# Patient Record
Sex: Female | Born: 1965 | Race: White | Hispanic: No | Marital: Married | Smoking: Never smoker
Health system: Southern US, Community
[De-identification: ages and names within clinical notes are randomized; demographics above are authoritative.]

## PROBLEM LIST (undated history)

## (undated) DIAGNOSIS — E079 Disorder of thyroid, unspecified: Secondary | ICD-10-CM

## (undated) DIAGNOSIS — E039 Hypothyroidism, unspecified: Secondary | ICD-10-CM

## (undated) HISTORY — PX: COLONOSCOPY: SHX174

---

## 2004-10-06 ENCOUNTER — Other Ambulatory Visit: Admission: RE | Admit: 2004-10-06 | Discharge: 2004-10-06 | Payer: Self-pay | Admitting: Obstetrics and Gynecology

## 2004-10-20 ENCOUNTER — Ambulatory Visit (HOSPITAL_COMMUNITY): Admission: RE | Admit: 2004-10-20 | Discharge: 2004-10-20 | Payer: Self-pay | Admitting: Obstetrics and Gynecology

## 2006-08-28 ENCOUNTER — Other Ambulatory Visit: Admission: RE | Admit: 2006-08-28 | Discharge: 2006-08-28 | Payer: Self-pay | Admitting: Obstetrics and Gynecology

## 2006-11-20 ENCOUNTER — Encounter: Admission: RE | Admit: 2006-11-20 | Discharge: 2006-11-20 | Payer: Self-pay | Admitting: Obstetrics and Gynecology

## 2007-11-05 ENCOUNTER — Other Ambulatory Visit: Admission: RE | Admit: 2007-11-05 | Discharge: 2007-11-05 | Payer: Self-pay | Admitting: Obstetrics and Gynecology

## 2007-12-10 ENCOUNTER — Encounter: Admission: RE | Admit: 2007-12-10 | Discharge: 2007-12-10 | Payer: Self-pay | Admitting: Obstetrics and Gynecology

## 2008-12-29 ENCOUNTER — Other Ambulatory Visit: Admission: RE | Admit: 2008-12-29 | Discharge: 2008-12-29 | Payer: Self-pay | Admitting: Obstetrics and Gynecology

## 2009-01-07 ENCOUNTER — Encounter: Admission: RE | Admit: 2009-01-07 | Discharge: 2009-01-07 | Payer: Self-pay | Admitting: Obstetrics and Gynecology

## 2010-03-04 ENCOUNTER — Other Ambulatory Visit: Admission: RE | Admit: 2010-03-04 | Discharge: 2010-03-04 | Payer: Self-pay | Admitting: Obstetrics and Gynecology

## 2010-03-10 ENCOUNTER — Encounter: Admission: RE | Admit: 2010-03-10 | Discharge: 2010-03-10 | Payer: Self-pay | Admitting: Obstetrics and Gynecology

## 2011-03-18 ENCOUNTER — Other Ambulatory Visit: Payer: Self-pay | Admitting: Obstetrics and Gynecology

## 2011-03-18 ENCOUNTER — Other Ambulatory Visit (HOSPITAL_COMMUNITY)
Admission: RE | Admit: 2011-03-18 | Discharge: 2011-03-18 | Disposition: A | Discharge status: Home / Self Care | Payer: BC Managed Care – PPO | Source: Ambulatory Visit | Attending: Obstetrics and Gynecology | Admitting: Obstetrics and Gynecology

## 2011-03-18 DIAGNOSIS — Z1231 Encounter for screening mammogram for malignant neoplasm of breast: Secondary | ICD-10-CM

## 2011-03-18 DIAGNOSIS — Z01419 Encounter for gynecological examination (general) (routine) without abnormal findings: Secondary | ICD-10-CM | POA: Insufficient documentation

## 2011-03-31 ENCOUNTER — Ambulatory Visit
Admission: RE | Admit: 2011-03-31 | Discharge: 2011-03-31 | Disposition: A | Discharge status: Home / Self Care | Payer: BC Managed Care – PPO | Source: Ambulatory Visit | Attending: Obstetrics and Gynecology | Admitting: Obstetrics and Gynecology

## 2011-03-31 DIAGNOSIS — Z1231 Encounter for screening mammogram for malignant neoplasm of breast: Secondary | ICD-10-CM

## 2012-05-09 ENCOUNTER — Other Ambulatory Visit: Payer: Self-pay | Admitting: Obstetrics and Gynecology

## 2012-05-09 ENCOUNTER — Other Ambulatory Visit (HOSPITAL_COMMUNITY)
Admission: RE | Admit: 2012-05-09 | Discharge: 2012-05-09 | Disposition: A | Discharge status: Home / Self Care | Payer: BC Managed Care – PPO | Source: Ambulatory Visit | Attending: Obstetrics and Gynecology | Admitting: Obstetrics and Gynecology

## 2012-05-09 DIAGNOSIS — Z01419 Encounter for gynecological examination (general) (routine) without abnormal findings: Secondary | ICD-10-CM | POA: Insufficient documentation

## 2012-05-14 ENCOUNTER — Other Ambulatory Visit: Payer: Self-pay | Admitting: Obstetrics and Gynecology

## 2012-05-14 DIAGNOSIS — Z1231 Encounter for screening mammogram for malignant neoplasm of breast: Secondary | ICD-10-CM

## 2012-05-16 ENCOUNTER — Ambulatory Visit
Admission: RE | Admit: 2012-05-16 | Discharge: 2012-05-16 | Disposition: A | Discharge status: Home / Self Care | Payer: BC Managed Care – PPO | Source: Ambulatory Visit | Attending: Obstetrics and Gynecology | Admitting: Obstetrics and Gynecology

## 2012-05-16 DIAGNOSIS — Z1231 Encounter for screening mammogram for malignant neoplasm of breast: Secondary | ICD-10-CM

## 2013-05-09 ENCOUNTER — Other Ambulatory Visit: Payer: Self-pay | Admitting: Obstetrics and Gynecology

## 2013-05-09 ENCOUNTER — Other Ambulatory Visit (HOSPITAL_COMMUNITY)
Admission: RE | Admit: 2013-05-09 | Discharge: 2013-05-09 | Disposition: A | Discharge status: Home / Self Care | Payer: BC Managed Care – PPO | Source: Ambulatory Visit | Attending: Obstetrics and Gynecology | Admitting: Obstetrics and Gynecology

## 2013-05-09 DIAGNOSIS — Z1151 Encounter for screening for human papillomavirus (HPV): Secondary | ICD-10-CM | POA: Insufficient documentation

## 2013-05-09 DIAGNOSIS — Z01419 Encounter for gynecological examination (general) (routine) without abnormal findings: Secondary | ICD-10-CM | POA: Insufficient documentation

## 2013-05-21 ENCOUNTER — Other Ambulatory Visit: Payer: Self-pay

## 2013-05-21 DIAGNOSIS — Z1231 Encounter for screening mammogram for malignant neoplasm of breast: Secondary | ICD-10-CM

## 2013-06-02 ENCOUNTER — Observation Stay (HOSPITAL_COMMUNITY)
Admission: EM | Admit: 2013-06-02 | Discharge: 2013-06-03 | Disposition: A | Discharge status: Home / Self Care | Payer: BC Managed Care – PPO | Attending: Internal Medicine | Admitting: Internal Medicine

## 2013-06-02 ENCOUNTER — Encounter (HOSPITAL_COMMUNITY): Payer: Self-pay | Admitting: Emergency Medicine

## 2013-06-02 DIAGNOSIS — M549 Dorsalgia, unspecified: Secondary | ICD-10-CM

## 2013-06-02 DIAGNOSIS — M79609 Pain in unspecified limb: Secondary | ICD-10-CM | POA: Insufficient documentation

## 2013-06-02 DIAGNOSIS — M79601 Pain in right arm: Secondary | ICD-10-CM

## 2013-06-02 DIAGNOSIS — R6884 Jaw pain: Principal | ICD-10-CM

## 2013-06-02 DIAGNOSIS — E039 Hypothyroidism, unspecified: Secondary | ICD-10-CM | POA: Insufficient documentation

## 2013-06-02 HISTORY — DX: Disorder of thyroid, unspecified: E07.9

## 2013-06-02 LAB — CBC WITH DIFFERENTIAL/PLATELET
Basophils Absolute: 0.1 10*3/uL (ref 0.0–0.1)
HCT: 36.5 % (ref 36.0–46.0)
Lymphocytes Relative: 21 % (ref 12–46)
Monocytes Absolute: 0.4 10*3/uL (ref 0.1–1.0)
Neutro Abs: 5.4 10*3/uL (ref 1.7–7.7)
Neutrophils Relative %: 70 % (ref 43–77)
RDW: 12.3 % (ref 11.5–15.5)
WBC: 7.7 10*3/uL (ref 4.0–10.5)

## 2013-06-02 LAB — TROPONIN I
Troponin I: <0.3 ng/mL (ref <0.30)
Troponin I: <0.3 ng/mL (ref <0.30)

## 2013-06-02 LAB — CBC
Platelets: 375 10*3/uL (ref 150–400)
RDW: 12.4 % (ref 11.5–15.5)
WBC: 6.7 10*3/uL (ref 4.0–10.5)

## 2013-06-02 LAB — URINALYSIS, ROUTINE W REFLEX MICROSCOPIC
Hgb urine dipstick: NEGATIVE
Nitrite: NEGATIVE
Specific Gravity, Urine: 1.007 (ref 1.005–1.030)
Urobilinogen, UA: 0.2 mg/dL (ref 0.0–1.0)
pH: 6.5 (ref 5.0–8.0)

## 2013-06-02 LAB — POCT I-STAT TROPONIN I: Troponin i, poc: 0.01 ng/mL (ref 0.00–0.08)

## 2013-06-02 LAB — GLUCOSE, CAPILLARY: Glucose-Capillary: 100 mg/dL — ABNORMAL HIGH (ref 70–99)

## 2013-06-02 LAB — BASIC METABOLIC PANEL
CO2: 26 mEq/L (ref 19–32)
Chloride: 104 mEq/L (ref 96–112)
Creatinine, Ser: 0.86 mg/dL (ref 0.50–1.10)
GFR calc Af Amer: >90 mL/min (ref >90)
Potassium: 4 mEq/L (ref 3.5–5.1)
Sodium: 139 mEq/L (ref 135–145)

## 2013-06-02 LAB — CREATININE, SERUM
GFR calc Af Amer: >90 mL/min (ref >90)
GFR calc non Af Amer: >90 mL/min (ref >90)

## 2013-06-02 MED ORDER — MORPHINE SULFATE 2 MG/ML IJ SOLN: 2.0000 mg | INTRAMUSCULAR | Status: DC | PRN

## 2013-06-02 MED ORDER — SODIUM CHLORIDE 0.9 % IJ SOLN
3.0000 mL | Freq: Two times a day (BID) | INTRAMUSCULAR | Status: DC
  Administered 2013-06-02 – 2013-06-03 (×3): 3 mL via INTRAVENOUS

## 2013-06-02 MED ORDER — ACETAMINOPHEN 325 MG PO TABS
650.0000 mg | ORAL_TABLET | Freq: Four times a day (QID) | ORAL | Status: DC | PRN
  Administered 2013-06-02 (×2): 650 mg via ORAL
  Filled 2013-06-02 (×2): qty 2

## 2013-06-02 MED ORDER — SODIUM CHLORIDE 0.9 % IV SOLN
INTRAVENOUS | Status: DC
  Administered 2013-06-02: 10:00:00 via INTRAVENOUS

## 2013-06-02 MED ORDER — NITROGLYCERIN 0.4 MG SL SUBL
0.4000 mg | SUBLINGUAL_TABLET | SUBLINGUAL | Status: AC | PRN
  Administered 2013-06-02 (×3): 0.4 mg via SUBLINGUAL

## 2013-06-02 MED ORDER — ASPIRIN 81 MG PO CHEW
81.0000 mg | CHEWABLE_TABLET | Freq: Every day | ORAL | Status: DC
  Administered 2013-06-02 – 2013-06-03 (×2): 81 mg via ORAL
  Filled 2013-06-02 (×2): qty 1

## 2013-06-02 MED ORDER — BISACODYL 5 MG PO TBEC
5.0000 mg | DELAYED_RELEASE_TABLET | Freq: Every day | ORAL | Status: DC | PRN
  Filled 2013-06-02: qty 1

## 2013-06-02 MED ORDER — LEVOTHYROXINE SODIUM 125 MCG PO TABS
125.0000 ug | ORAL_TABLET | Freq: Every day | ORAL | Status: DC
  Administered 2013-06-02 – 2013-06-03 (×2): 125 ug via ORAL
  Filled 2013-06-02 (×4): qty 1

## 2013-06-02 MED ORDER — NITROGLYCERIN 0.4 MG/SPRAY TL SOLN
1.0000 | Status: DC | PRN
  Filled 2013-06-02: qty 4.9

## 2013-06-02 MED ORDER — MORPHINE SULFATE 4 MG/ML IJ SOLN: 4.0000 mg | Freq: Once | INTRAMUSCULAR | Status: DC

## 2013-06-02 MED ORDER — HYDROCODONE-ACETAMINOPHEN 5-325 MG PO TABS
1.0000 | ORAL_TABLET | ORAL | Status: DC | PRN
  Administered 2013-06-03: 1 via ORAL
  Filled 2013-06-02: qty 1

## 2013-06-02 MED ORDER — ACETAMINOPHEN 650 MG RE SUPP: 650.0000 mg | Freq: Four times a day (QID) | RECTAL | Status: DC | PRN

## 2013-06-02 MED ORDER — NITROGLYCERIN IN D5W 200-5 MCG/ML-% IV SOLN
5.0000 ug/min | INTRAVENOUS | Status: DC
  Administered 2013-06-02: 5 ug/min via INTRAVENOUS
  Administered 2013-06-02: 10 ug/min via INTRAVENOUS
  Filled 2013-06-02: qty 250

## 2013-06-02 MED ORDER — KETOROLAC TROMETHAMINE 30 MG/ML IJ SOLN
30.0000 mg | Freq: Once | INTRAMUSCULAR | Status: AC
  Administered 2013-06-02: 30 mg via INTRAVENOUS
  Filled 2013-06-02: qty 1

## 2013-06-02 MED ORDER — ASPIRIN 81 MG PO CHEW
324.0000 mg | CHEWABLE_TABLET | Freq: Once | ORAL | Status: AC
  Administered 2013-06-02: 324 mg via ORAL
  Filled 2013-06-02: qty 4

## 2013-06-02 MED ORDER — ENOXAPARIN SODIUM 40 MG/0.4ML ~~LOC~~ SOLN
40.0000 mg | SUBCUTANEOUS | Status: DC
  Administered 2013-06-02: 40 mg via SUBCUTANEOUS
  Filled 2013-06-02 (×2): qty 0.4

## 2013-06-02 NOTE — ED Notes (Signed)
CONTACT:  Jimmye Norman CHARLES:  [phone].  Please call when pt is assigned to a room.

## 2013-06-02 NOTE — H&P (Signed)
[facility] History and Physical  Susan Santiago RUE:[id number] DOB: 1966-04-21 DOA: 06/02/2013  Referring physician: Anne Shutter PA PCP: No primary provider on file.  Specialists:   Chief Complaint: Jaw Pain  HPI: Susan Santiago is a 47 y.o. female with no significant past medical history, presented to the emergency department with complaints of lower back pain, right arm pain and jaw pain. She has been doing a lot of work around her house since last Friday packing as her her family are moving to a new home. Yesterday while cleaning her kitchen she noticed lower back pain, occurring about 7:00. Then at approximately 9 PM she noticed right arm pain and jaw pain, growing concern she was brought to the emergency department by her husband. She characterizes her arm right arm pain as achy, constant that can be exacerbated by movement. Her job and has also remained constant. She denies a cardiac history or undergoing previous heart catheterizations. She denied associated nausea vomiting palpitations dizziness lightheadedness diaphoresis. In the emergency department there was concern for this reflected an anginal equivalent and was started on a nitroglycerin drip. Initial set of cardiac enzymes are negative and EKG did not reveal ischemic changes. Patient during my encounter is reporting improvement to her pain symptoms.  Review of Systems: The patient denies anorexia, fever, weight loss,, vision loss, decreased hearing, hoarseness, chest pain, syncope, dyspnea on exertion, peripheral edema, balance deficits, hemoptysis, abdominal pain, melena, hematochezia, severe indigestion/heartburn, hematuria, incontinence, genital sores, muscle weakness, suspicious skin lesions, transient blindness, difficulty walking, depression, unusual weight change, abnormal bleeding, enlarged lymph nodes, angioedema, and breast masses.    Past Medical History  Diagnosis Date  . Thyroid disease    History reviewed. No  pertinent past surgical history. Social History:  reports that she has never smoked. She does not have any smokeless tobacco history on file. She reports that she does not drink alcohol or use illicit drugs. She is currently married, has 2 children, works at [employer].  No Known Allergies  No family history on file. her mother having a history of coronary artery disease, with her first MI at the age of 70. Father also with history of heart disease.  Prior to Admission medications   Medication Sig Start Date End Date Taking? Authorizing Provider  ibuprofen (ADVIL,MOTRIN) 200 MG tablet Take 400 mg by mouth every 6 (six) hours as needed for pain.   Yes Historical Provider, MD  levothyroxine (SYNTHROID, LEVOTHROID) 125 MCG tablet Take 125 mcg by mouth daily before breakfast.   Yes Historical Provider, MD  Multiple Vitamin (MULTIVITAMIN WITH MINERALS) TABS tablet Take 1 tablet by mouth daily.   Yes Historical Provider, MD   Physical Exam: Filed Vitals:   06/02/13 0600  BP: 128/77  Pulse: 79  Temp:   Resp: 23     General:  Patient is in no acute distress she is awake alert oriented x3  Eyes: Pupils are equal round reactive to light extraocular movements are  ENT: Normal  Neck: Supple no jugular venous distention  Cardiovascular: Regular rate rhythm normal S1-S2 no murmurs rubs or gallop  Respiratory: Lungs are clear to auscultation bilaterally no wheezing rhonchi oral  Abdomen: Abdomen is soft nontender nontender positive bowel sounds in upper quad  Skin: Skin is  Musculoskeletal: Patient having some pain to palpation across lower abdomen  Psychiatric: Awake alert oriented x3  Neurologic: Nonfocal neurologic examination, no alteration to sensation, 5 out of 5 muscle strength  Labs on Admission:  Basic  Metabolic Panel:  Recent Labs Lab 06/02/13 0527  NA 139  K 4.0  CL 104  CO2 26  GLUCOSE 114*  BUN 12  CREATININE 0.86  CALCIUM 8.8   Liver Function  Tests: No results found for this basename: AST, ALT, ALKPHOS, BILITOT, PROT, ALBUMIN,  in the last 168 hours No results found for this basename: LIPASE, AMYLASE,  in the last 168 hours No results found for this basename: AMMONIA,  in the last 168 hours CBC:  Recent Labs Lab 06/02/13 0527  WBC 7.7  NEUTROABS 5.4  HGB 13.1  HCT 36.5  MCV 86.5  PLT 381   Cardiac Enzymes: No results found for this basename: CKTOTAL, CKMB, CKMBINDEX, TROPONINI,  in the last 168 hours  BNP (last 3 results) No results found for this basename: PROBNP,  in the last 8760 hours CBG: No results found for this basename: GLUCAP,  in the last 168 hours  Radiological Exams on Admission: No results found.  EKG: Normal sinus rhythm  Assessment/Plan Active Problems:   Jaw pain   Pain of right arm   Back pain   Jaw pain/arm pain There is concerns that this could reflect an anginal equivalent. Symptoms started overnight, with workup in the emergency department unremarkable. Patient having negative cardiac enzymes, EKG showing no ischemic changes. This may possibly be related to musculoskeletal origin as she reports having a busy weekend packing up their home. However she does have a positive family history for coronary artery disease with her mother having her first MI at the age of 49 years old. I discussed case with Dr. Diona Browner of cardiology who felt it would be reasonable to cycle 3 sets of cardiac enzymes and have her followup for an outpatient stress test if enzymes remain negative. Dr. Diona Browner suggested calling tomorrow in setting up for outpatient stress test. Will start aspirin 81 mg by mouth daily, check a fasting lipid panel for risk stratification.  Back pain Patient presenting with non-emergent back pain, I suspect related to muscle strain, patient reporting doing a lot of packing over the weekend. Will provide supportive care, as necessary analgesia.  History of hypothyroidism Continue Synthroid  125 mcg by mouth daily. We'll check a TSH.  DVT prophylaxis Enoxaparin subcutaneous     Code Status: Full code Family Communication: Plan discussed with patient at bedside Disposition Plan: Place him overnight observation, cycle cardiac enzymes, if they remain negative, plan to discharge tomorrow with cardiology consultation to set up outpatient stress test.  Time spent: 60  Jeralyn Bennett [facility] Pager [phone]  If 7PM-7AM, please contact night-coverage [email or web address] Password [facility] 06/02/2013, 8:19 AM

## 2013-06-02 NOTE — ED Notes (Signed)
Admitting MD Vanessa Barbara at bedside.

## 2013-06-02 NOTE — Plan of Care (Signed)
Problem: Consults Goal: Chest Pain Patient Education (See Patient Education module for education specifics.) Outcome: Completed/Met Date Met:  06/02/13 Pt has viewed videos 905.106 and 108  Problem: Phase I Progression Outcomes Goal: Anginal pain relieved Outcome: Completed/Met Date Met:  06/02/13 Nitro gtt 3cc/hr

## 2013-06-02 NOTE — Progress Notes (Signed)
Utilization review complete. Isidoro Donning RN CCM Case [phone]

## 2013-06-02 NOTE — ED Notes (Signed)
Pt. reports mid / low back pain onset this evening , denies fall or injury , no urinary discomfort , pt. stated she has been " cleaning " the house .

## 2013-06-02 NOTE — ED Provider Notes (Signed)
CSN: [id number]     Arrival date & time 06/02/13  0203 History   First MD Initiated Contact with Patient 06/02/13 [phone]     Chief Complaint  Patient presents with  . Back Pain   (Consider location/radiation/quality/duration/timing/severity/associated sxs/prior Treatment) HPI Comments: Patient presents with a chief complaint of left lower back pain.  She reports that she started having the pain around 7 PM this evening.  Pain did not radiate.  Pain was worse with movement.  Denies numbness or tingling.  Denies bowel or bladder incontinence.  She took Ibuprofen, which helped with the pain.  She is not having any back pain at this time.  She reports that she was cleaning her house, which she feels brought on the back pain.  However, she reports that 2 hours after the onset of back pain she developed tightness in her right arm and also pain in her jaw.  She continues to have jaw pain at this time.  She denies any chest pain, SOB, numbness, tingling, nausea, vomiting, dizziness, lightheadedness, or syncope.  She denies any prior history of cardiac disease.  She has never had a stress test in the past.  Denies history of HTN, Hyperlipidemia, or DM.  She does not smoke.  Patient reports that both her mother and her father had a MI at the age of 92.  Patient is a 47 y.o. female presenting with back pain. The history is provided by the patient.  Back Pain Associated symptoms: no fever     Past Medical History  Diagnosis Date  . Thyroid disease    History reviewed. No pertinent past surgical history. No family history on file. History  Substance Use Topics  . Smoking status: Never Smoker   . Smokeless tobacco: Not on file  . Alcohol Use: No   OB History   Grav Para Term Preterm Abortions TAB SAB Ect Mult Living                 Review of Systems  Constitutional: Negative for fever and chills.  HENT: Negative for neck pain and neck stiffness.        Jaw pain  Musculoskeletal: Positive for back  pain.       Right arm pain  All other systems reviewed and are negative.    Allergies  Review of patient's allergies indicates no known allergies.  Home Medications  No current outpatient prescriptions on file. BP 175/84  Pulse 72  Temp(Src) 98 F (36.7 C) (Oral)  Resp 16  SpO2 98%  LMP 05/26/2013 Physical Exam  Nursing note and vitals reviewed. Constitutional: She appears well-developed and well-nourished. No distress.  HENT:  Head: Normocephalic and atraumatic.  Mouth/Throat: Uvula is midline, oropharynx is clear and moist and mucous membranes are normal. No trismus in the jaw. Normal dentition. No dental abscesses or dental caries.  Neck: Normal range of motion. Neck supple.  Cardiovascular: Normal rate, regular rhythm, normal heart sounds and intact distal pulses.   Pulmonary/Chest: Effort normal and breath sounds normal.  Neurological: She is alert.  Skin: Skin is warm and dry. She is not diaphoretic.  Psychiatric: She has a normal mood and affect.    ED Course  Procedures (including critical care time) Labs Review Labs Reviewed  CBC WITH DIFFERENTIAL  BASIC METABOLIC PANEL   Imaging Review No results found.   Date: 06/02/2013  Rate: 62  Rhythm: normal sinus rhythm  QRS Axis: normal  Intervals: normal  ST/T Wave abnormalities: nonspecific ST changes  Conduction Disutrbances:none  Narrative Interpretation:   Old EKG Reviewed: unchanged   MDM  No diagnosis found. Patient presenting with a chief complaint of back pain.  She reports that the back pain has resolved.  However, she developed right arm pain and jaw pain.  Concern for ACS etiology.  No ischemic changes on EKG.  Initial troponin negative.  Patient admitted to [facility] for further work up of pain.    Pascal Lux [place], PA-C 06/03/13 [phone]

## 2013-06-03 ENCOUNTER — Encounter: Payer: Self-pay | Admitting: Physician Assistant

## 2013-06-03 DIAGNOSIS — R6884 Jaw pain: Secondary | ICD-10-CM

## 2013-06-03 DIAGNOSIS — M79609 Pain in unspecified limb: Secondary | ICD-10-CM

## 2013-06-03 DIAGNOSIS — M549 Dorsalgia, unspecified: Secondary | ICD-10-CM

## 2013-06-03 LAB — BASIC METABOLIC PANEL
GFR calc Af Amer: >90 mL/min (ref >90)
GFR calc non Af Amer: >90 mL/min (ref >90)
Potassium: 3.7 mEq/L (ref 3.5–5.1)
Sodium: 139 mEq/L (ref 135–145)

## 2013-06-03 LAB — CBC
Hemoglobin: 11.4 g/dL — ABNORMAL LOW (ref 12.0–15.0)
RBC: 3.8 MIL/uL — ABNORMAL LOW (ref 3.87–5.11)
WBC: 7.3 10*3/uL (ref 4.0–10.5)

## 2013-06-03 LAB — LIPID PANEL
Cholesterol: 148 mg/dL (ref 0–200)
HDL: 52 mg/dL (ref >39)
Total CHOL/HDL Ratio: 2.8 RATIO
Triglycerides: 64 mg/dL (ref <150)

## 2013-06-03 MED ORDER — ASPIRIN 81 MG PO CHEW: 81.0000 mg | CHEWABLE_TABLET | Freq: Every day | ORAL | Status: DC

## 2013-06-03 MED ORDER — HYDROCODONE-ACETAMINOPHEN 5-325 MG PO TABS: 1.0000 | ORAL_TABLET | ORAL | Status: DC | PRN

## 2013-06-03 NOTE — ED Provider Notes (Signed)
Medical screening examination/treatment/procedure(s) were performed by non-physician practitioner and as supervising physician I was immediately available for consultation/collaboration.  Jariana Shumard, MD 06/03/13 0443 

## 2013-06-03 NOTE — Progress Notes (Signed)
Pt resting on bed comfortable, denies any CP or discomfort, no distress noticed. We'll continue with POC.

## 2013-06-03 NOTE — Progress Notes (Signed)
Dr. Brien Few requested exercise nuclear stress test and f/u appt (atypical CP) upon pt's discharge from hospital. I have left a message on our office's scheduling voicemail requesting these appts, and our office will call the patient. Johnmichael Melhorn PA-C

## 2013-06-03 NOTE — Discharge Summary (Signed)
Physician Discharge Summary  Susan Santiago ZOX:[id number] DOB: 10-20-1965 DOA: 06/02/2013  PCP: No primary provider on file.  Admit date: 06/02/2013 Discharge date: 06/03/2013  Time spent: 40 minutes  Recommendations for Outpatient Follow-up:  1. Follow up with Dr Nona Dell, [facility] cardiology, for stress myoview.   Discharge Diagnoses:  Active Problems:   Jaw pain   Pain of right arm   Back pain   Discharge Condition: Satisfactory.   Diet recommendation: Regular.   Filed Weights   06/02/13 0943  Weight: 61.2 kg (134 lb 14.7 oz)    History of present illness:  47 y.o. female with history of hypothyroidism, presenting to the emergency department with complaints of lower back pain, right arm pain and jaw pain. She had been doing a lot of work around her house since 05/31/13, packing, as her family is moving to a new home. On 06/02/13, while cleaning her kitchen she noticed lower back pain, occurring about 7:00 PM. Then at approximately 9:00 PM, she noticed right arm pain and jaw pain, became concerned, and was brought to the emergency department by her husband. She characterizes her arm right arm pain as achey, constant that can be exacerbated by movement. Her jaw pain has also remained constant. She denies a cardiac history or previous heart catheterizations. She denied associated nausea vomiting palpitations dizziness lightheadedness or diaphoresis. In the emergency department there was concern for possible anginal equivalent and she was started on a Nitroglycerin drip. Initial set of cardiac enzymes are negative and EKG did not reveal ischemic changes. Admitted for further management.    Hospital Course:  1. Jaw pain/arm pain: Patient presented with atypical-sounding right arm/jaw pain, raising concerns that this could reflect an anginal equivalent, however, 12-Lead EKG was devoid of acute ischemic changes, cardiac enzymes remained unelevated, and no arrhythmias were documented on  telemetric monitoring. This may possibly be of musculoskeletal origin as she has a busy few days, packing up their home. Her only cardiovascular risk factor is positive family history for coronary artery disease with her mother having her first MI at the age of 49 years old.  Dr Vanessa Barbara, admitting MD, discussed case in detail, with Dr. Diona Browner, cardiologist, who felt it would be reasonable to evaluate with an outpatient stress. Now on Aspirin 81 mg by mouth daily. Patient had no recurrence of pain, during hospitalization. Lipid profile showed TC 148, TG 64, HDL 52, LDL 83.  2. Back pain: Patient also had some low back pain at presentation, likely due to muscle strain. Managed with prn analgesics, with satisfactory response.  3. History of hypothyroidism: Continued on Synthroid 125 mcg by mouth daily. TSH is normal at 3.847.   Procedures:  N/A.   Consultations: Telephone conversation with Dr Nona Dell, cardiologist, on 06/02/13.   Discharge Exam: Filed Vitals:   06/03/13 0542  BP: 136/56  Pulse: 54  Temp: 98.3 F (36.8 C)  Resp: 18    General: Comfortable, alert, communicative, fully oriented, not short of breath at rest.  HEENT: No clinical pallor, no jaundice, no conjunctival injection or discharge. Hydration is fair.  NECK: Supple, JVP not seen, no carotid bruits, no palpable lymphadenopathy, no palpable goiter.  CHEST: Clinically clear to auscultation, no wheezes, no crackles.  HEART: Sounds 1 and 2 heard, normal, regular, no murmurs.  ABDOMEN: Flat, soft, non-tender, no palpable organomegaly, no palpable masses, normal bowel sounds.  GENITALIA: Not examined.  LOWER EXTREMITIES: No pitting edema, palpable peripheral pulses.  MUSCULOSKELETAL SYSTEM: Unremarkable.  CENTRAL NERVOUS  SYSTEM: No focal neurologic deficit on gross examination.  Discharge Instructions      Discharge Orders   Future Appointments Provider Department Dept Phone   06/20/2013 2:20 PM [facility] [phone]   Patient should wear two piece clothing and wear no powder or deodorant. Patient should arrive 15 minutes early.   Future Orders Complete By Expires   Diet general  As directed    Increase activity slowly  As directed        Medication List         aspirin 81 MG chewable tablet  Chew 1 tablet (81 mg total) by mouth daily.     HYDROcodone-acetaminophen 5-325 MG per tablet  Commonly known as:  NORCO/VICODIN  Take 1 tablet by mouth every 4 (four) hours as needed.     ibuprofen 200 MG tablet  Commonly known as:  ADVIL,MOTRIN  Take 400 mg by mouth every 6 (six) hours as needed for pain.     levothyroxine 125 MCG tablet  Commonly known as:  SYNTHROID, LEVOTHROID  Take 125 mcg by mouth daily before breakfast.     multivitamin with minerals Tabs tablet  Take 1 tablet by mouth daily.       No Known Allergies Follow-up Information   Follow up with Nona Dell, MD. (cardiology office will contact you with date and time of stress test. )    Specialty:  Cardiology   Contact information:   [address] [place] [address] [phone]        The results of significant diagnostics from this hospitalization (including imaging, microbiology, ancillary and laboratory) are listed below for reference.    Significant Diagnostic Studies: No results found.  Microbiology: No results found for this or any previous visit (from the past 240 hour(s)).   Labs: Basic Metabolic Panel:  Recent Labs Lab 06/02/13 0527 06/02/13 1030 06/03/13 0406  NA 139  --  139  K 4.0  --  3.7  CL 104  --  108  CO2 26  --  24  GLUCOSE 114*  --  88  BUN 12  --  9  CREATININE 0.86 0.77 0.71  CALCIUM 8.8  --  7.8*   Liver Function Tests: No results found for this basename: AST, ALT, ALKPHOS, BILITOT, PROT, ALBUMIN,  in the last 168 hours No results found for this basename: LIPASE, AMYLASE,  in the last 168 hours No results found for this  basename: AMMONIA,  in the last 168 hours CBC:  Recent Labs Lab 06/02/13 0527 06/02/13 1030 06/03/13 0406  WBC 7.7 6.7 7.3  NEUTROABS 5.4  --   --   HGB 13.1 12.2 11.4*  HCT 36.5 34.9* 33.7*  MCV 86.5 87.7 88.7  PLT 381 375 328   Cardiac Enzymes:  Recent Labs Lab 06/02/13 1003 06/02/13 1603 06/02/13 2200  TROPONINI <0.30 <0.30 <0.30   BNP: BNP (last 3 results) No results found for this basename: PROBNP,  in the last 8760 hours CBG:  Recent Labs Lab 06/02/13 2100  GLUCAP 100*       Signed:  Melia Hopes,CHRISTOPHER  [facility] 06/03/2013, 10:27 AM

## 2013-06-04 ENCOUNTER — Other Ambulatory Visit (HOSPITAL_COMMUNITY): Payer: Self-pay | Admitting: Cardiology

## 2013-06-04 DIAGNOSIS — R079 Chest pain, unspecified: Secondary | ICD-10-CM

## 2013-06-11 ENCOUNTER — Telehealth: Payer: Self-pay

## 2013-06-11 NOTE — Telephone Encounter (Signed)
New problem '    Per after hour voice mail on 8/30 per Danya Dunn Pa. Pt need an exercise nuclear stress test to est with cardiology.   Called on 9/9 @ 11:42 spoke with husband Leonette Most who states patient has a cardiologist with Dr. Sharyn Lull

## 2013-06-12 ENCOUNTER — Encounter (HOSPITAL_COMMUNITY)
Admission: RE | Admit: 2013-06-12 | Discharge: 2013-06-12 | Disposition: A | Discharge status: Home / Self Care | Payer: BC Managed Care – PPO | Source: Ambulatory Visit | Attending: Cardiology | Admitting: Cardiology

## 2013-06-12 ENCOUNTER — Other Ambulatory Visit: Payer: Self-pay

## 2013-06-12 DIAGNOSIS — R6884 Jaw pain: Secondary | ICD-10-CM | POA: Insufficient documentation

## 2013-06-12 DIAGNOSIS — R079 Chest pain, unspecified: Secondary | ICD-10-CM

## 2013-06-12 DIAGNOSIS — M79609 Pain in unspecified limb: Secondary | ICD-10-CM | POA: Insufficient documentation

## 2013-06-12 MED ORDER — TECHNETIUM TC 99M SESTAMIBI GENERIC - CARDIOLITE
10.0000 | Freq: Once | INTRAVENOUS | Status: AC | PRN
  Administered 2013-06-12: 10 via INTRAVENOUS

## 2013-06-12 MED ORDER — TECHNETIUM TC 99M SESTAMIBI GENERIC - CARDIOLITE
30.0000 | Freq: Once | INTRAVENOUS | Status: AC | PRN
  Administered 2013-06-12: 30 via INTRAVENOUS

## 2013-06-20 ENCOUNTER — Ambulatory Visit
Admission: RE | Admit: 2013-06-20 | Discharge: 2013-06-20 | Disposition: A | Discharge status: Home / Self Care | Payer: BC Managed Care – PPO | Source: Ambulatory Visit

## 2013-06-20 DIAGNOSIS — Z1231 Encounter for screening mammogram for malignant neoplasm of breast: Secondary | ICD-10-CM

## 2014-06-13 ENCOUNTER — Other Ambulatory Visit (HOSPITAL_COMMUNITY)
Admission: RE | Admit: 2014-06-13 | Discharge: 2014-06-13 | Disposition: A | Discharge status: Home / Self Care | Payer: BC Managed Care – PPO | Source: Ambulatory Visit | Attending: Obstetrics and Gynecology | Admitting: Obstetrics and Gynecology

## 2014-06-13 ENCOUNTER — Other Ambulatory Visit: Payer: Self-pay | Admitting: Obstetrics and Gynecology

## 2014-06-13 DIAGNOSIS — Z01419 Encounter for gynecological examination (general) (routine) without abnormal findings: Secondary | ICD-10-CM | POA: Insufficient documentation

## 2014-06-16 LAB — CYTOLOGY - PAP

## 2014-07-25 ENCOUNTER — Other Ambulatory Visit: Payer: Self-pay

## 2014-07-25 DIAGNOSIS — Z1239 Encounter for other screening for malignant neoplasm of breast: Secondary | ICD-10-CM

## 2014-07-28 ENCOUNTER — Ambulatory Visit: Payer: BC Managed Care – PPO

## 2014-07-30 ENCOUNTER — Other Ambulatory Visit: Payer: Self-pay

## 2014-07-30 ENCOUNTER — Ambulatory Visit
Admission: RE | Admit: 2014-07-30 | Discharge: 2014-07-30 | Disposition: A | Discharge status: Home / Self Care | Payer: BC Managed Care – PPO | Source: Ambulatory Visit

## 2014-07-30 DIAGNOSIS — Z1231 Encounter for screening mammogram for malignant neoplasm of breast: Secondary | ICD-10-CM

## 2015-10-06 ENCOUNTER — Other Ambulatory Visit: Payer: Self-pay | Admitting: Obstetrics and Gynecology

## 2015-10-06 ENCOUNTER — Other Ambulatory Visit (HOSPITAL_COMMUNITY)
Admission: RE | Admit: 2015-10-06 | Discharge: 2015-10-06 | Disposition: A | Discharge status: Home / Self Care | Payer: BLUE CROSS/BLUE SHIELD | Source: Ambulatory Visit | Attending: Obstetrics and Gynecology | Admitting: Obstetrics and Gynecology

## 2015-10-06 DIAGNOSIS — Z01419 Encounter for gynecological examination (general) (routine) without abnormal findings: Secondary | ICD-10-CM | POA: Diagnosis not present

## 2015-10-09 LAB — CYTOLOGY - PAP

## 2015-11-30 ENCOUNTER — Other Ambulatory Visit: Payer: Self-pay

## 2015-11-30 DIAGNOSIS — Z1231 Encounter for screening mammogram for malignant neoplasm of breast: Secondary | ICD-10-CM

## 2015-12-17 ENCOUNTER — Ambulatory Visit
Admission: RE | Admit: 2015-12-17 | Discharge: 2015-12-17 | Disposition: A | Discharge status: Home / Self Care | Payer: BLUE CROSS/BLUE SHIELD | Source: Ambulatory Visit

## 2015-12-17 DIAGNOSIS — Z1231 Encounter for screening mammogram for malignant neoplasm of breast: Secondary | ICD-10-CM

## 2015-12-21 ENCOUNTER — Other Ambulatory Visit: Payer: Self-pay | Admitting: Obstetrics and Gynecology

## 2015-12-21 DIAGNOSIS — R928 Other abnormal and inconclusive findings on diagnostic imaging of breast: Secondary | ICD-10-CM

## 2015-12-29 ENCOUNTER — Ambulatory Visit
Admission: RE | Admit: 2015-12-29 | Discharge: 2015-12-29 | Disposition: A | Discharge status: Home / Self Care | Payer: BLUE CROSS/BLUE SHIELD | Source: Ambulatory Visit | Attending: Obstetrics and Gynecology | Admitting: Obstetrics and Gynecology

## 2015-12-29 DIAGNOSIS — R928 Other abnormal and inconclusive findings on diagnostic imaging of breast: Secondary | ICD-10-CM

## 2016-10-07 ENCOUNTER — Other Ambulatory Visit: Payer: Self-pay | Admitting: Obstetrics and Gynecology

## 2016-10-07 ENCOUNTER — Other Ambulatory Visit (HOSPITAL_COMMUNITY)
Admission: RE | Admit: 2016-10-07 | Discharge: 2016-10-07 | Disposition: A | Discharge status: Home / Self Care | Payer: BLUE CROSS/BLUE SHIELD | Source: Ambulatory Visit | Attending: Obstetrics and Gynecology | Admitting: Obstetrics and Gynecology

## 2016-10-07 DIAGNOSIS — Z1151 Encounter for screening for human papillomavirus (HPV): Secondary | ICD-10-CM | POA: Diagnosis present

## 2016-10-07 DIAGNOSIS — Z01419 Encounter for gynecological examination (general) (routine) without abnormal findings: Secondary | ICD-10-CM | POA: Insufficient documentation

## 2016-10-12 LAB — CYTOLOGY - PAP
Diagnosis: NEGATIVE
HPV: NOT DETECTED

## 2016-12-21 ENCOUNTER — Other Ambulatory Visit: Payer: Self-pay | Admitting: Obstetrics and Gynecology

## 2016-12-21 DIAGNOSIS — Z1231 Encounter for screening mammogram for malignant neoplasm of breast: Secondary | ICD-10-CM

## 2017-01-13 ENCOUNTER — Ambulatory Visit: Payer: BLUE CROSS/BLUE SHIELD

## 2017-02-01 ENCOUNTER — Ambulatory Visit
Admission: RE | Admit: 2017-02-01 | Discharge: 2017-02-01 | Disposition: A | Discharge status: Home / Self Care | Payer: BLUE CROSS/BLUE SHIELD | Source: Ambulatory Visit | Attending: Obstetrics and Gynecology | Admitting: Obstetrics and Gynecology

## 2017-02-01 DIAGNOSIS — Z1231 Encounter for screening mammogram for malignant neoplasm of breast: Secondary | ICD-10-CM

## 2018-07-20 ENCOUNTER — Other Ambulatory Visit: Payer: Self-pay | Admitting: Obstetrics and Gynecology

## 2018-07-20 DIAGNOSIS — Z1231 Encounter for screening mammogram for malignant neoplasm of breast: Secondary | ICD-10-CM

## 2018-08-29 ENCOUNTER — Ambulatory Visit
Admission: RE | Admit: 2018-08-29 | Discharge: 2018-08-29 | Disposition: A | Discharge status: Home / Self Care | Payer: BC Managed Care – PPO | Source: Ambulatory Visit | Attending: Obstetrics and Gynecology | Admitting: Obstetrics and Gynecology

## 2018-08-29 DIAGNOSIS — Z1231 Encounter for screening mammogram for malignant neoplasm of breast: Secondary | ICD-10-CM

## 2019-04-01 ENCOUNTER — Other Ambulatory Visit: Payer: Self-pay | Admitting: Obstetrics and Gynecology

## 2019-05-19 ENCOUNTER — Other Ambulatory Visit: Payer: Self-pay | Admitting: Obstetrics and Gynecology

## 2019-05-19 NOTE — H&P (Signed)
Subjective: Chief Complaint(s):   pre op   HPI:  Isolation Precautions 1. Is fever present / reported?: No, 2. Are respiratory illness symptom(s) present / reported?: No, 3. Are other symptom(s) present / reported?: No, 5. Has there been reported travel to a High Risk respiratory illness region?: No, 6. Has close* contact with person(s) known to have communicable illness been reported?: No, 7. Did travel or close contact (if applicable) occur within 14 days of symptom onset?: No" label="Respiratory Illness Screening" propId="[id number]" catId="[id number]" encId="[id number]"Respiratory Illness Screening 1. Is fever present / reported? No, 2. Are respiratory illness symptom(s) present / reported? No, 3. Are other symptom(s) present / reported? No, 5. Has there been reported travel to a High Risk respiratory illness region? No, 6. Has close* contact with person(s) known to have communicable illness been reported? No, 7. Did travel or close contact (if applicable) occur within 14 days of symptom onset? No.  General 53 y/o presents for pre op visit Pt. is scheduled for a hysteroscopy d&c with removal of endometrial mass using myosure on 05/22/2019 at 2 pm for the evaluation and management of postmenopausal bleeding and suspected endometrial mass.  Her last menses was on 06/2018. She has not had a menses since but began bleeding on 03/11/2019. She is also complaining of bloating and abdominal discomfort. She states that she started spotting 03/11/2019 which stopped around 03/20/2019. She states that the spotting has pretty much stopped now.  She had an ultrasound 04/01/2019, which showed her uterus measuring 8.6 x 5.6 x 5.4cm. The endometrium was thickened at 1.3cm, no uterine anomalies were seen. There is questionable mass in the endometrium measuring 2.6 x 1.8 x 1.3cm, no blood flow was noted. Ovaries appear normal bilaterally.  She had an endometrial biopsy 04/01/2019, which was benign. She had a polyp removed at  this time as well which was benign.  [place] performed in office on 04/16/2019. Questionable mass visualized in the endometrial lining measuring 3 x 1.6cm. Pt. denies experiencing abnormal discharge. Current Medication: Taking Levothyroxine Sodium 112 MCG Tablet 1 tablet every morning on an empty stomach Orally Once a day. Flonase(Fluticasone Propionate) 50 MCG/ACT Suspension 1 spray in each nostril Nasally Once a day a needed. Cetirizine HCl 10 MG Tablet 1 tablet Orally Once a day. Mometasone Furoate 0.1 % Cream 1 application to affected area Externally Twice a day as needed for rash, Notes: prn. Fish Oil 1000 MG Capsule 1 capsule Orally Once a day. One-A-Day Womens Tablet 1 tablet Orally Once a day. Calcium + D3 600-200 MG-UNIT Tablet 1 tablet with a meal Orally Once a day. Not-Taking Polyethylene Glycol 3350 17 GM Packet 1 packet mixed with 8 ounces of fluid Orally tid. Premarin(Estrogens Conjugated) 0.625 MG/GM Cream as directed Vaginal Once a day for 2 weeks then 2 times a week. Medication List reviewed and reconciled with the patient. Medical History:  Hypothyroidism     Hypercholesterolemia      Allergies/Intolerance:  N.K.D.A.   Gyn History:  Sexual activity currently sexually active. Periods : irregular. LMP 06/2018. Denies Birth control none. Last pap smear date 10/07/16 - WNL. Last mammogram date 08/29/2018-normal . Denies Abnormal pap smear. Denies STD. Menarche 44.   OB History:  Number of pregnancies 2. Pregnancy # 1 vaginal delivery, live birth, girl. Pregnancy # 2 live birth, vaginal delivery, boy. NVD 2.   Surgical History:  colonoscopy 2018   Hospitalization:  childbirth x 2 ([facility])   Family History:  Father: deceased, diagnosed with Coronary artery disease in 74,  Diabetes    Mother: deceased, Coronary artery disease in 7658    Paternal Grand Father: deceased    Paternal Grand Mother: deceased    Maternal Grand Father: deceased    Maternal Grand Mother: deceased,  cancer?    Brother 1: alive, gout, Diabetes    Brother2: alive, Prostate CA    Sister 1: deceased, accident    Sister 2: deceased, Ovarian cancer    2 brother(s) , 2 sister(s) . 1 son(s) , 1 daughter(s) .    No known family hx of colon cancer/polyps or liver disease.  Social History: General no EXPOSURE TO PASSIVE SMOKE.  Alcohol: yes, once per month, at most - wine.  DIET: low fat.  EDUCATION: Associate Degree.  Seat belt use: yes.  DENTAL CARE: good.  no Recreational drug use.  Exercise: walks occasionally.  Children: 1, Boys, 1, girls.  Caffeine: 1-2 serving daily, coffee.  COMMUNICATION BARRIERS: none.  Tobacco use cigarettes: Never smoked, Tobacco history last updated 05/13/2019.  Marital Status: married.  OCCUPATION: employed, [employer].   ROS: CONSTITUTIONAL No" label="Chills" value="" options="no,yes" propid="91" itemid="193425" categoryid="10464" encounterid="[id number]"Chills No. No" label="Fatigue" value="" options="no,yes" propid="91" itemid="[id number]" categoryid="10464" encounterid="[id number]"Fatigue No. No" label="Fever" value="" options="no,yes" propid="91" itemid="10467" categoryid="10464" encounterid="[id number]"Fever No. No" label="Night sweats" value="" options="no,yes" propid="91" itemid="193426" categoryid="10464" encounterid="[id number]"Night sweats No. No" label="Recent travel outside [place]" value="" options="no,yes" propid="91" itemid="444261" categoryid="10464" encounterid="[id number]"Recent travel outside [place] No. No" label="Sweats" value="" options="no,yes" propid="91" itemid="193427" categoryid="10464" encounterid="[id number]"Sweats No. No" label="Weight change" value="" options="no,yes" propid="91" itemid="194825" categoryid="10464" encounterid="[id number]"Weight change No.  OPHTHALMOLOGY no" label="Blurring of vision" value="" options="no,yes" propid="91" itemid="12520" categoryid="12516" encounterid="[id number]"Blurring of vision no. no" label="Change  in vision" value="" options="no,yes" propid="91" itemid="193469" categoryid="12516" encounterid="[id number]"Change in vision no. no" label="Double vision" value="" options="no,yes" propid="91" itemid="194379" categoryid="12516" encounterid="[id number]"Double vision no.  ENT no" label="Dizziness" value="" options="no,yes" propid="91" itemid="193612" categoryid="10481" encounterid="[id number]"Dizziness no. Nose bleeds no. Sore throat no. Teeth pain no.  ALLERGY no" label="Hives" value="" options="no,yes" propid="91" itemid="202589" categoryid="138152" encounterid="[id number]"Hives no.  CARDIOLOGY no" label="Chest pain" value="" options="no,yes" propid="91" itemid="193603" categoryid="10488" encounterid="[id number]"Chest pain no. no" label="High blood pressure" value="" options="no,yes" propid="91" itemid="199089" categoryid="10488" encounterid="[id number]"High blood pressure no. no" label="Irregular heart beat" value="" options="no,yes" propid="91" itemid="202598" categoryid="10488" encounterid="[id number]"Irregular heart beat no. no" label="Leg edema" value="" options="no,yes" propid="91" itemid="10491" categoryid="10488" encounterid="[id number]"Leg edema no. no" label="Palpitations" value="" options="no,yes" propid="91" itemid="10490" categoryid="10488" encounterid="[id number]"Palpitations no.  RESPIRATORY no" label="Shortness of breath" value="" options="no" propid="91" itemid="270013" categoryid="138132" encounterid="[id number]"Shortness of breath no. no" label="Cough" value="" options="no,yes" propid="91" itemid="172745" categoryid="138132" encounterid="[id number]"Cough no. no" label="Wheezing" value="" options="no,yes" propid="91" itemid="193621" categoryid="138132" encounterid="[id number]"Wheezing no.  UROLOGY no" label="Pain with urination" value="" options="no,yes" propid="91" itemid="194377" categoryid="138166" encounterid="[id number]"Pain with urination no. no" label="Urinary urgency" value="" options="no,yes" propid="91"  itemid="193493" categoryid="138166" encounterid="[id number]"Urinary urgency no. no" label="Urinary frequency" value="" options="no,yes" propid="91" itemid="193492" categoryid="138166" encounterid="[id number]"Urinary frequency no. no" label="Urinary incontinence" value="" options="no,yes" propid="91" itemid="138171" categoryid="138166" encounterid="[id number]"Urinary incontinence no. No" label="Difficulty urinating" value="" options="no,yes" propid="91" itemid="138167" categoryid="138166" encounterid="[id number]"Difficulty urinating No. No" label="Blood in urine" value="" options="no,yes" propid="91" itemid="138168" categoryid="138166" encounterid="[id number]"Blood in urine No.  GASTROENTEROLOGY no" label="Abdominal pain" value="" options="no,yes" propid="91" itemid="10496" categoryid="10494" encounterid="[id number]"Abdominal pain no. no" label="Appetite change" value="" options="no,yes" propid="91" itemid="193447" categoryid="10494" encounterid="[id number]"Appetite change no. no" label="Bloating/belching" value="" options="no,yes" propid="91" itemid="193448" categoryid="10494" encounterid="[id number]"Bloating/belching no. no" label="Blood in stool or on toilet paper" value="" options="no,yes" propid="91" itemid="10503" categoryid="10494" encounterid="[id number]"Blood in stool or on toilet paper no. no" label="Change in bowel movements" value="" options="no,yes" propid="91" itemid="199106" categoryid="10494" encounterid="[id number]"Change in bowel movements no. no" label="Constipation" value="" options="no,yes" propid="91" itemid="10501" categoryid="10494" encounterid="[id number]"Constipation no. no" label="Diarrhea" value="" options="no,yes" propid="91" itemid="10502" categoryid="10494" encounterid="[id number]"Diarrhea no. no" label="Difficulty swallowing" value="" options="no,yes" propid="91" itemid="199104" categoryid="10494" encounterid="[id number]"Difficulty swallowing no. no" label="Nausea" value="" options="no,yes" propid="91"  itemid="10499"  categoryid="10494" encounterid="[id number]"Nausea no.  FEMALE REPRODUCTIVE no" label="Vulvar pain" value="" options="no,yes" propid="91" itemid="453725" categoryid="10525" encounterid="[id number]"Vulvar pain no. no" label="Vulvar rash" value="" options="no,yes" propid="91" itemid="453726" categoryid="10525" encounterid="[id number]"Vulvar rash no. yes" label="Abnormal vaginal bleeding" value="" options="no, yes" propid="91" itemid="444315" categoryid="10525" encounterid="[id number]"Abnormal vaginal bleeding yes. no" label="Breast pain" value="" options="no,yes" propid="91" itemid="186083" categoryid="10525" encounterid="[id number]"Breast pain no. no" label="Nipple discharge" value="" options="no,yes" propid="91" itemid="186084" categoryid="10525" encounterid="[id number]"Nipple discharge no. no" label="Pain with intercourse" value="" options="no,yes" propid="91" itemid="275823" categoryid="10525" encounterid="[id number]"Pain with intercourse no. no" label="Pelvic pain" value="" options="no,yes" propid="91" itemid="186082" categoryid="10525" encounterid="[id number]"Pelvic pain no. no" label="Unusual vaginal discharge" value="" options="no,yes" propid="91" itemid="278230" categoryid="10525" encounterid="[id number]"Unusual vaginal discharge no. no" label="Vaginal itching" value="" options="no,yes" propid="91" itemid="278942" categoryid="10525" encounterid="[id number]"Vaginal itching no.  MUSCULOSKELETAL no" label="Muscle aches" value="" options="no,yes" propid="91" itemid="193461" categoryid="10514" encounterid="[id number]"Muscle aches no.  NEUROLOGY no" label="Headache" value="" options="no,yes" propid="91" itemid="12513" categoryid="12512" encounterid="[id number]"Headache no. no" label="Tingling/numbness" value="" options="no,yes" propid="91" itemid="12514" categoryid="12512" encounterid="[id number]"Tingling/numbness no. no" label="Weakness" value="" options="no,yes" propid="91" itemid="193468" categoryid="12512"  encounterid="[id number]"Weakness no.  PSYCHOLOGY no" label="Depression" value="" options="" propid="91" itemid="275919" categoryid="10520" encounterid="[id number]"Depression no. no" label="Anxiety" value="" options="no,yes" propid="91" itemid="172748" categoryid="10520" encounterid="[id number]"Anxiety no. no" label="Nervousness" value="" options="no,yes" propid="91" itemid="199158" categoryid="10520" encounterid="[id number]"Nervousness no. no" label="Sleep disturbances" value="" options="no,yes" propid="91" itemid="12502" categoryid="10520" encounterid="[id number]"Sleep disturbances no. no " label="Suicidal ideation" value="" options="no,yes" propid="91" itemid="72718" categoryid="10520" encounterid="[id number]"Suicidal ideation no .  ENDOCRINOLOGY no" label="Excessive thirst" value="" options="no,yes" propid="91" itemid="194628" categoryid="12508" encounterid="[id number]"Excessive thirst no. no" label="Excessive urination" value="" options="no,yes" propid="91" itemid="196285" categoryid="12508" encounterid="[id number]"Excessive urination no. no" label="Hair loss" value="" options="no, yes" propid="91" itemid="444314" categoryid="12508" encounterid="[id number]"Hair loss no. no" label="Heat or cold intolerance" value="" options="" propid="91" itemid="447284" categoryid="12508" encounterid="[id number]"Heat or cold intolerance no.  HEMATOLOGY/LYMPH no" label="Abnormal bleeding" value="" options="no,yes" propid="91" itemid="199152" categoryid="138157" encounterid="[id number]"Abnormal bleeding no. no" label="Easy bruising" value="" options="no,yes" propid="91" itemid="170653" categoryid="138157" encounterid="[id number]"Easy bruising no. no" label="Swollen glands" value="" options="no,yes" propid="91" itemid="138158" categoryid="138157" encounterid="[id number]"Swollen glands no.  DERMATOLOGY no" label="New/changing skin lesion" value="" options="no,yes" propid="91" itemid="199126" categoryid="12503" encounterid="[id number]"New/changing skin  lesion no. no" label="Rash" value="" options="no,yes" propid="91" itemid="12504" categoryid="12503" encounterid="[id number]"Rash no. no" label="Sores" value="" options="" propid="91" itemid="444313" categoryid="12503" encounterid="[id number]"Sores no.   Negative except as stated in HPI.  Objective: Vitals: Wt 148, Ht 66, BMI 23.89, Temp 97.1, Pulse sitting 59, BP sitting 130/74  Past Results: Examination:  General Examination alert, oriented, NAD " label="CONSTITUTIONAL:" categoryPropId="10089" examid="193638"CONSTITUTIONAL: alert, oriented, NAD .  moist, warm" label="SKIN:" categoryPropId="10109" examid="193638"SKIN: moist, warm.  Conjunctiva clear" label="EYES:" categoryPropId="21468" examid="193638"EYES: Conjunctiva clear.  clear to auscultation bilaterally" label="LUNGS:" categoryPropId="87" examid="193638"LUNGS: clear to auscultation bilaterally.  regular rate and rhythm" label="HEART:" categoryPropId="86" examid="193638"HEART: regular rate and rhythm.  soft, non-tender/non-distended, bowel sounds present " label="ABDOMEN:" categoryPropId="88" examid="193638"ABDOMEN: soft, non-tender/non-distended, bowel sounds present .  normal external genitalia, labia - unremarkable, vagina - pink moist mucosa, no lesions or abnormal discharge, cervix - no discharge or lesions or CMT, adnexa - no masses or tenderness, uterus - nontender and normal size on palpation " label="FEMALE GENITOURINARY:" categoryPropId="13414" examid="193638"FEMALE GENITOURINARY: normal external genitalia, labia - unremarkable, vagina - pink moist mucosa, no lesions or abnormal discharge, cervix - no discharge or lesions or CMT, adnexa - no masses or tenderness, uterus - nontender and normal size on palpation .  affect normal, good eye contact" label="PSYCH:" categoryPropId="16316" examid="193638"PSYCH: affect normal, good eye contact.  Physical Examination: Chaperone present for pelvic exam, Chapman,Courtney 05/13/2019 12:11:41 PM  &gt; " label="Chaperone present" itemId="[id number]" categoryId="[id number]"Chaperone present for pelvic exam, Chapman,Courtney 05/13/2019 12:11:41 PM > .   Pt aware of scribe services today.   Assessment: Assessment:  Abnormal uterine bleeding (AUB) - N93.9 (Primary)     Endometrial mass - N94.89     Plan: Treatment: Abnormal uterine bleeding (AUB) Notes: Pt. is scheduled for hysteroscopy d&c with  removal of endometrial mass using the myosure on 05/22/2019 at 2pm. Pt. advised to avoid ibuprofen, aleve, aspirin between now and surgery. No eating or drinking night prior to surgery. Discussed risk of surgery with pt. including risk of infection, bleeding, and damage to uterus with the need for further surgery. Discussed risk of blood transfusion. Discussed risk of hiv/hep b&c with blood transfusion. Patient is aware of risks and wishes to proceed if warranted. After 24 hours aside from sexual intercourse she can resume normal activities. she can not resume sex until 2 weeks after her surgery. Endometrial mass Notes: Pt. is scheduled for hysteroscopy d&c with removal of endometrial mass using the myosure on 05/22/2019 at 2pm.

## 2019-05-20 ENCOUNTER — Other Ambulatory Visit: Payer: Self-pay

## 2019-05-20 ENCOUNTER — Encounter (HOSPITAL_COMMUNITY): Payer: Self-pay

## 2019-05-20 ENCOUNTER — Other Ambulatory Visit (HOSPITAL_COMMUNITY)
Admission: RE | Admit: 2019-05-20 | Discharge: 2019-05-20 | Disposition: A | Discharge status: Home / Self Care | Payer: BC Managed Care – PPO | Source: Ambulatory Visit | Attending: Obstetrics and Gynecology | Admitting: Obstetrics and Gynecology

## 2019-05-20 ENCOUNTER — Encounter (HOSPITAL_COMMUNITY)
Admission: RE | Admit: 2019-05-20 | Discharge: 2019-05-20 | Disposition: A | Discharge status: Home / Self Care | Payer: BC Managed Care – PPO | Source: Ambulatory Visit | Attending: Obstetrics and Gynecology | Admitting: Obstetrics and Gynecology

## 2019-05-20 DIAGNOSIS — Z01812 Encounter for preprocedural laboratory examination: Secondary | ICD-10-CM | POA: Insufficient documentation

## 2019-05-20 DIAGNOSIS — Z20828 Contact with and (suspected) exposure to other viral communicable diseases: Secondary | ICD-10-CM | POA: Insufficient documentation

## 2019-05-20 HISTORY — DX: Hypothyroidism, unspecified: E03.9

## 2019-05-20 LAB — CBC
HCT: 41.6 % (ref 36.0–46.0)
Hemoglobin: 13.7 g/dL (ref 12.0–15.0)
MCH: 29.5 pg (ref 26.0–34.0)
MCHC: 32.9 g/dL (ref 30.0–36.0)
MCV: 89.5 fL (ref 80.0–100.0)
Platelets: 369 10*3/uL (ref 150–400)
RBC: 4.65 MIL/uL (ref 3.87–5.11)
RDW: 12.3 % (ref 11.5–15.5)
WBC: 6.2 10*3/uL (ref 4.0–10.5)
nRBC: 0 % (ref 0.0–0.2)

## 2019-05-20 LAB — SARS CORONAVIRUS 2 (TAT 6-24 HRS): SARS Coronavirus 2: NEGATIVE

## 2019-05-20 NOTE — Progress Notes (Signed)
PCP - Donnie Coffin Cardiologist - denies  Chest x-ray - N/A EKG - N/A Stress Test - 2014 ECHO - denies Cardiac Cath - denies  Sleep Study - denies  Aspirin Instructions: Patient instructed to hold all Aspirin, NSAID's, herbal medications, fish oil and vitamins 7 days prior to surgery.   Anesthesia review:   Patient denies shortness of breath, fever, cough and chest pain at PAT appointment   Patient verbalized understanding of instructions that were given to them at the PAT appointment. Patient was also instructed that they will need to review over the PAT instructions again at home before surgery.

## 2019-05-20 NOTE — Pre-Procedure Instructions (Signed)
[facility][id number] Gin[place] Otto- [place], [place] - [address] [facility] [address] [facility] [address] [place] [id number] Phone: [phone] Fax: [phone]      Your procedure is scheduled on Wednesday August 19th.  [facility] at 12:00 PM., and check in at the Admitting office.  Call this number if you have problems the morning of surgery:  [phone]  Call [phone] if you have any questions prior to your surgery date Monday-Friday 8am-4pm    Remember:  Do not eat or drink after midnight the night before your surgery  You may drink clear liquids until 11:00 AM the morning of your surgery.   Clear liquids allowed are: Water, Non-Citrus Juices (without pulp), Carbonated Beverages, Clear Tea, Black Coffee Only, and Gatorade    Take these medicines the morning of surgery with A SIP OF WATER  cetirizine (ZYRTEC) fluticasone (FLONASE) levothyroxine (SYNTHROID)  As of today, STOP taking any Aspirin (unless otherwise instructed by your surgeon), Aleve, Naproxen, Ibuprofen, Motrin, Advil, Goody's, BC's, all herbal medications, fish oil, and all vitamins.    The Morning of Surgery  Do not wear jewelry, make-up or nail polish.  Do not wear lotions, powders, or perfumes/colognes, or deodorant  Do not shave 48 hours prior to surgery.  Men may shave face and neck.  Do not bring valuables to the hospital.  [facility] any belongings or valuables.  If you are a smoker, DO NOT Smoke 24 hours prior to surgery IF you wear a CPAP at night please bring your mask, tubing, and machine the morning of surgery   Remember that you must have someone to transport you home after your surgery, and remain with you for 24 hours if you are discharged the same day.   Contacts, glasses, hearing aids, dentures or bridgework may not be worn into surgery.    Leave your suitcase in the car.  After surgery it may be brought to your  room.  For patients admitted to the hospital, discharge time will be determined by your treatment team.  Patients discharged the day of surgery will not be allowed to drive home.    Special instructions:   [facility]- Preparing For Surgery  Before surgery, you can play an important role. Because skin is not sterile, your skin needs to be as free of germs as possible. You can reduce the number of germs on your skin by washing with CHG (chlorahexidine gluconate) Soap before surgery.  CHG is an antiseptic cleaner which kills germs and bonds with the skin to continue killing germs even after washing.    Oral Hygiene is also important to reduce your risk of infection.  Remember - BRUSH YOUR TEETH THE MORNING OF SURGERY WITH YOUR REGULAR TOOTHPASTE  Please do not use if you have an allergy to CHG or antibacterial soaps. If your skin becomes reddened/irritated stop using the CHG.  Do not shave (including legs and underarms) for at least 48 hours prior to first CHG shower. It is OK to shave your face.  Please follow these instructions carefully.   1. Shower the NIGHT BEFORE SURGERY and the MORNING OF SURGERY with CHG Soap.   2. If you chose to wash your hair, wash your hair first as usual with your normal shampoo.  3. After you shampoo, rinse your hair and body thoroughly to remove the shampoo.  4. Use CHG as you would any  other liquid soap. You can apply CHG directly to the skin and wash gently with a scrungie or a clean washcloth.   5. Apply the CHG Soap to your body ONLY FROM THE NECK DOWN.  Do not use on open wounds or open sores. Avoid contact with your eyes, ears, mouth and genitals (private parts). Wash Face and genitals (private parts)  with your normal soap.   6. Wash thoroughly, paying special attention to the area where your surgery will be performed.  7. Thoroughly rinse your body with warm water from the neck down.  8. DO NOT shower/wash with your normal soap after using and  rinsing off the CHG Soap.  9. Pat yourself dry with a CLEAN TOWEL.  10. Wear CLEAN PAJAMAS to bed the night before surgery, wear comfortable clothes the morning of surgery  11. Place CLEAN SHEETS on your bed the night of your first shower and DO NOT SLEEP WITH PETS.    Day of Surgery:  Do not apply any deodorants/lotions. Please shower the morning of surgery with the CHG soap  Please wear clean clothes to the hospital/surgery center.   Remember to brush your teeth WITH YOUR REGULAR TOOTHPASTE.   Please read over the following fact sheets that you were given.

## 2019-05-22 ENCOUNTER — Encounter (HOSPITAL_COMMUNITY): Payer: Self-pay

## 2019-05-22 ENCOUNTER — Ambulatory Visit (HOSPITAL_COMMUNITY): Payer: BC Managed Care – PPO | Admitting: Certified Registered Nurse Anesthetist

## 2019-05-22 ENCOUNTER — Ambulatory Visit (HOSPITAL_COMMUNITY)
Admission: RE | Admit: 2019-05-22 | Discharge: 2019-05-22 | Disposition: A | Discharge status: Home / Self Care | Payer: BC Managed Care – PPO | Attending: Obstetrics and Gynecology | Admitting: Obstetrics and Gynecology

## 2019-05-22 ENCOUNTER — Other Ambulatory Visit: Payer: Self-pay

## 2019-05-22 ENCOUNTER — Encounter (HOSPITAL_COMMUNITY)
Admission: RE | Disposition: A | Discharge status: Home / Self Care | Payer: Self-pay | Source: Home / Self Care | Attending: Obstetrics and Gynecology

## 2019-05-22 DIAGNOSIS — Z79899 Other long term (current) drug therapy: Secondary | ICD-10-CM | POA: Insufficient documentation

## 2019-05-22 DIAGNOSIS — E7800 Pure hypercholesterolemia, unspecified: Secondary | ICD-10-CM | POA: Diagnosis not present

## 2019-05-22 DIAGNOSIS — N9489 Other specified conditions associated with female genital organs and menstrual cycle: Secondary | ICD-10-CM | POA: Diagnosis not present

## 2019-05-22 DIAGNOSIS — N840 Polyp of corpus uteri: Secondary | ICD-10-CM | POA: Diagnosis present

## 2019-05-22 DIAGNOSIS — N950 Postmenopausal bleeding: Secondary | ICD-10-CM | POA: Diagnosis present

## 2019-05-22 DIAGNOSIS — Z79890 Hormone replacement therapy (postmenopausal): Secondary | ICD-10-CM | POA: Diagnosis not present

## 2019-05-22 DIAGNOSIS — E039 Hypothyroidism, unspecified: Secondary | ICD-10-CM | POA: Insufficient documentation

## 2019-05-22 HISTORY — PX: DILATATION & CURETTAGE/HYSTEROSCOPY WITH MYOSURE: SHX6511

## 2019-05-22 LAB — POCT PREGNANCY, URINE: Preg Test, Ur: NEGATIVE

## 2019-05-22 SURGERY — DILATATION & CURETTAGE/HYSTEROSCOPY WITH MYOSURE
Anesthesia: General | Site: Vagina

## 2019-05-22 MED ORDER — LIDOCAINE 2% (20 MG/ML) 5 ML SYRINGE
INTRAMUSCULAR | Status: DC | PRN
  Administered 2019-05-22: 60 mg via INTRAVENOUS

## 2019-05-22 MED ORDER — DEXAMETHASONE SODIUM PHOSPHATE 10 MG/ML IJ SOLN
INTRAMUSCULAR | Status: AC
  Filled 2019-05-22: qty 1

## 2019-05-22 MED ORDER — DEXAMETHASONE SODIUM PHOSPHATE 10 MG/ML IJ SOLN
INTRAMUSCULAR | Status: DC | PRN
  Administered 2019-05-22: 10 mg via INTRAVENOUS

## 2019-05-22 MED ORDER — FENTANYL CITRATE (PF) 250 MCG/5ML IJ SOLN
INTRAMUSCULAR | Status: AC
  Filled 2019-05-22: qty 5

## 2019-05-22 MED ORDER — KETOROLAC TROMETHAMINE 30 MG/ML IJ SOLN
INTRAMUSCULAR | Status: AC
  Filled 2019-05-22: qty 1

## 2019-05-22 MED ORDER — MIDAZOLAM HCL 2 MG/2ML IJ SOLN
INTRAMUSCULAR | Status: AC
  Filled 2019-05-22: qty 2

## 2019-05-22 MED ORDER — BUPIVACAINE HCL (PF) 0.25 % IJ SOLN
INTRAMUSCULAR | Status: AC
  Filled 2019-05-22: qty 30

## 2019-05-22 MED ORDER — ACETAMINOPHEN 10 MG/ML IV SOLN: 1000.0000 mg | Freq: Once | INTRAVENOUS | Status: DC | PRN

## 2019-05-22 MED ORDER — IBUPROFEN 800 MG PO TABS: 800.0000 mg | ORAL_TABLET | Freq: Three times a day (TID) | ORAL | 0 refills | Status: AC | PRN

## 2019-05-22 MED ORDER — LACTATED RINGERS IV SOLN
INTRAVENOUS | Status: DC
  Administered 2019-05-22: 12:00:00 via INTRAVENOUS

## 2019-05-22 MED ORDER — MIDAZOLAM HCL 5 MG/5ML IJ SOLN
INTRAMUSCULAR | Status: DC | PRN
  Administered 2019-05-22: 2 mg via INTRAVENOUS

## 2019-05-22 MED ORDER — ONDANSETRON HCL 4 MG/2ML IJ SOLN
INTRAMUSCULAR | Status: DC | PRN
  Administered 2019-05-22: 4 mg via INTRAVENOUS

## 2019-05-22 MED ORDER — FENTANYL CITRATE (PF) 100 MCG/2ML IJ SOLN
INTRAMUSCULAR | Status: DC | PRN
  Administered 2019-05-22: 50 ug via INTRAVENOUS
  Administered 2019-05-22: 25 ug via INTRAVENOUS

## 2019-05-22 MED ORDER — BUPIVACAINE HCL (PF) 0.25 % IJ SOLN
INTRAMUSCULAR | Status: DC | PRN
  Administered 2019-05-22: 20 mL

## 2019-05-22 MED ORDER — LACTATED RINGERS IV SOLN: INTRAVENOUS | Status: DC

## 2019-05-22 MED ORDER — SODIUM CHLORIDE 0.9 % IR SOLN
Status: DC | PRN
  Administered 2019-05-22: 3000 mL

## 2019-05-22 MED ORDER — ONDANSETRON HCL 4 MG/2ML IJ SOLN
INTRAMUSCULAR | Status: AC
  Filled 2019-05-22: qty 2

## 2019-05-22 MED ORDER — FENTANYL CITRATE (PF) 100 MCG/2ML IJ SOLN: 25.0000 ug | INTRAMUSCULAR | Status: DC | PRN

## 2019-05-22 MED ORDER — OXYCODONE HCL 5 MG PO TABS: 5.0000 mg | ORAL_TABLET | Freq: Once | ORAL | Status: DC | PRN

## 2019-05-22 MED ORDER — PROPOFOL 10 MG/ML IV BOLUS
INTRAVENOUS | Status: DC | PRN
  Administered 2019-05-22: 150 mg via INTRAVENOUS

## 2019-05-22 MED ORDER — OXYCODONE HCL 5 MG/5ML PO SOLN: 5.0000 mg | Freq: Once | ORAL | Status: DC | PRN

## 2019-05-22 MED ORDER — KETOROLAC TROMETHAMINE 30 MG/ML IJ SOLN
INTRAMUSCULAR | Status: DC | PRN
  Administered 2019-05-22: 30 mg via INTRAVENOUS

## 2019-05-22 MED ORDER — LIDOCAINE 2% (20 MG/ML) 5 ML SYRINGE
INTRAMUSCULAR | Status: AC
  Filled 2019-05-22: qty 5

## 2019-05-22 MED ORDER — PROMETHAZINE HCL 25 MG/ML IJ SOLN: 6.2500 mg | INTRAMUSCULAR | Status: DC | PRN

## 2019-05-22 SURGICAL SUPPLY — 19 items
CANISTER SUCT 3000ML PPV (MISCELLANEOUS) ×3 IMPLANT
CATH ROBINSON RED A/P 16FR (CATHETERS) ×3 IMPLANT
DEVICE MYOSURE LITE (MISCELLANEOUS) IMPLANT
DEVICE MYOSURE REACH (MISCELLANEOUS) IMPLANT
DILATOR CANAL MILEX (MISCELLANEOUS) ×2 IMPLANT
GLOVE BIOGEL M 6.5 STRL (GLOVE) ×3 IMPLANT
GLOVE BIOGEL PI IND STRL 6.5 (GLOVE) ×1 IMPLANT
GLOVE BIOGEL PI IND STRL 7.0 (GLOVE) ×1 IMPLANT
GLOVE BIOGEL PI INDICATOR 6.5 (GLOVE) ×2
GLOVE BIOGEL PI INDICATOR 7.0 (GLOVE) ×2
GOWN STRL REUS W/ TWL LRG LVL3 (GOWN DISPOSABLE) ×2 IMPLANT
GOWN STRL REUS W/TWL LRG LVL3 (GOWN DISPOSABLE) ×6
KIT PROCEDURE FLUENT (KITS) ×3 IMPLANT
KIT TURNOVER KIT B (KITS) ×3 IMPLANT
PACK VAGINAL MINOR WOMEN LF (CUSTOM PROCEDURE TRAY) ×3 IMPLANT
PAD OB MATERNITY 4.3X12.25 (PERSONAL CARE ITEMS) ×3 IMPLANT
SEAL ROD LENS SCOPE MYOSURE (ABLATOR) ×3 IMPLANT
TOWEL GREEN STERILE FF (TOWEL DISPOSABLE) ×6 IMPLANT
UNDERPAD 30X30 (UNDERPADS AND DIAPERS) ×3 IMPLANT

## 2019-05-22 NOTE — Transfer of Care (Signed)
Immediate Anesthesia Transfer of Care Note  Patient: Susan Santiago  Procedure(s) Performed: DILATATION & CURETTAGE/HYSTEROSCOPY WITH MYOSURE POLYPECTOMY (N/A Vagina )  Patient Location: PACU  Anesthesia Type:General  Level of Consciousness: awake, alert  and oriented  Airway & Oxygen Therapy: Patient Spontanous Breathing and Patient connected to face mask oxygen  Post-op Assessment: Report given to RN and Post -op Vital signs reviewed and stable  Post vital signs: Reviewed and stable  Last Vitals:  Vitals Value Taken Time  BP 121/74 05/22/19 1438  Temp    Pulse 56 05/22/19 1439  Resp 10 05/22/19 1439  SpO2 100 % 05/22/19 1439  Vitals shown include unvalidated device data.  Last Pain:  Vitals:   05/22/19 1207  TempSrc:   PainSc: 0-No pain         Complications: No apparent anesthesia complications

## 2019-05-22 NOTE — Op Note (Signed)
05/22/2019  2:42 PM  PATIENT:  Susan Santiago  53 y.o. female  PRE-OPERATIVE DIAGNOSIS:  N94.89 endometrial mass  POST-OPERATIVE DIAGNOSIS:  endometrial mass  PROCEDURE:  Procedure(s) with comments: DILATATION & CURETTAGE/HYSTEROSCOPY WITH MYOSURE POLYPECTOMY (N/A) - Rep will be present.  Confirmed on 05/20/19 CS  SURGEON:  Surgeon(s) and Role:    * Christophe Louis, MD - Primary  PHYSICIAN ASSISTANT: None  ASSISTANTS: none   ANESTHESIA:   general  EBL:  5 mL   BLOOD ADMINISTERED:none  DRAINS: none   LOCAL MEDICATIONS USED:  MARCAINE     SPECIMEN:  Source of Specimen:  endometrial polyp  DISPOSITION OF SPECIMEN:  PATHOLOGY  COUNTS:  YES  TOURNIQUET:  * No tourniquets in log *  DICTATION: .Dragon Dictation  PLAN OF CARE: Discharge to home after PACU  PATIENT DISPOSITION:  PACU - hemodynamically stable.   Delay start of Pharmacological VTE agent (>24hrs) due to surgical blood loss or risk of bleeding: not applicableProcedure: Patient was taken to the operating room where she was placed under general anesthesia. She was placed in the dorsal lithotomy position. She was prepped and draped in the usual sterile fashion. A speculum was placed into the vaginal vault. The anterior lip of the cervix was grasped with a single-tooth tenaculum. Quarter percent Marcaine was injected at the 4 and 8:00 positions of the cervix. The cervix was then sounded to 7 cm. The cervix was dilated to approximately 6 mm. Mysosure operative  hysteroscope was inserted. The findings noted above. Myosure reach  blade was introduced throught the hysteroscope. The endometrial mass was removed in 2 minutes.  There was no evidence of perforation. Hysteroscope was then removed.  The single-tooth tenaculum was removed from the anterior lip of the cervix.  excellent hemostasis was noted. The speculum was removed from the patient's vagina. She was awakened from anesthesia taken care  To the recovery  room awake and in  stable condition. Sponge lap and needle counts were correct x2.

## 2019-05-22 NOTE — H&P (Signed)
Date of Initial H&P: 05/19/19  History reviewed, patient examined, no change in status, stable for surgery.

## 2019-05-22 NOTE — Anesthesia Procedure Notes (Signed)
Procedure Name: LMA Insertion Date/Time: 05/22/2019 2:11 PM Performed by: Genelle Bal, CRNA Pre-anesthesia Checklist: Patient identified, Emergency Drugs available, Suction available and Patient being monitored Patient Re-evaluated:Patient Re-evaluated prior to induction Oxygen Delivery Method: Circle system utilized Preoxygenation: Pre-oxygenation with 100% oxygen Induction Type: IV induction Ventilation: Mask ventilation without difficulty LMA: LMA inserted LMA Size: 4.0 Number of attempts: 1 Airway Equipment and Method: Bite block Placement Confirmation: positive ETCO2 Tube secured with: Tape Dental Injury: Teeth and Oropharynx as per pre-operative assessment

## 2019-05-22 NOTE — Anesthesia Preprocedure Evaluation (Addendum)
Anesthesia Evaluation  Patient identified by MRN, date of birth, ID band Patient awake and Patient confused    Reviewed: Allergy & Precautions, NPO status , Patient's Chart, lab work & pertinent test results  History of Anesthesia Complications Negative for: history of anesthetic complications  Airway Mallampati: II  TM Distance: >3 FB Neck ROM: Full    Dental no notable dental hx.    Pulmonary neg pulmonary ROS,    Pulmonary exam normal        Cardiovascular negative cardio ROS Normal cardiovascular exam     Neuro/Psych negative neurological ROS     GI/Hepatic negative GI ROS, Neg liver ROS,   Endo/Other  Hypothyroidism   Renal/GU negative Renal ROS     Musculoskeletal negative musculoskeletal ROS (+)   Abdominal   Peds  Hematology negative hematology ROS (+)   Anesthesia Other Findings Day of surgery medications reviewed with the patient.  Reproductive/Obstetrics Endometrial mass                            Anesthesia Physical Anesthesia Plan  ASA: II  Anesthesia Plan: General   Post-op Pain Management:    Induction: Intravenous  PONV Risk Score and Plan: 3 and Treatment may vary due to age or medical condition, Ondansetron, Dexamethasone and Midazolam  Airway Management Planned: LMA  Additional Equipment:   Intra-op Plan:   Post-operative Plan: Extubation in OR  Informed Consent: I have reviewed the patients History and Physical, chart, labs and discussed the procedure including the risks, benefits and alternatives for the proposed anesthesia with the patient or authorized representative who has indicated his/her understanding and acceptance.     Dental advisory given  Plan Discussed with: CRNA  Anesthesia Plan Comments:        Anesthesia Quick Evaluation

## 2019-05-23 ENCOUNTER — Encounter (HOSPITAL_COMMUNITY): Payer: Self-pay | Admitting: Obstetrics and Gynecology

## 2019-05-23 NOTE — Anesthesia Postprocedure Evaluation (Signed)
Anesthesia Post Note  Patient: Susan Santiago  Procedure(s) Performed: DILATATION & CURETTAGE/HYSTEROSCOPY WITH MYOSURE POLYPECTOMY (N/A Vagina )     Patient location during evaluation: PACU Anesthesia Type: General Level of consciousness: awake and alert and oriented Pain management: pain level controlled Vital Signs Assessment: post-procedure vital signs reviewed and stable Respiratory status: spontaneous breathing, nonlabored ventilation and respiratory function stable Cardiovascular status: blood pressure returned to baseline Postop Assessment: no apparent nausea or vomiting Anesthetic complications: no    Last Vitals:  Vitals:   05/22/19 1455 05/22/19 1510  BP: (!) 141/75 (!) 144/76  Pulse: 61 (!) 58  Resp: 15 16  Temp: (!) 36.3 C   SpO2: 100% 100%    Last Pain:  Vitals:   05/22/19 1510  TempSrc:   PainSc: 0-No pain                 Brennan Bailey

## 2019-07-30 ENCOUNTER — Other Ambulatory Visit: Payer: Self-pay | Admitting: Obstetrics and Gynecology

## 2019-07-30 DIAGNOSIS — Z1231 Encounter for screening mammogram for malignant neoplasm of breast: Secondary | ICD-10-CM

## 2019-09-24 ENCOUNTER — Ambulatory Visit: Payer: BC Managed Care – PPO

## 2019-09-25 ENCOUNTER — Other Ambulatory Visit: Payer: Self-pay

## 2019-09-25 ENCOUNTER — Ambulatory Visit
Admission: RE | Admit: 2019-09-25 | Discharge: 2019-09-25 | Disposition: A | Discharge status: Home / Self Care | Payer: BC Managed Care – PPO | Source: Ambulatory Visit | Attending: Obstetrics and Gynecology | Admitting: Obstetrics and Gynecology

## 2019-09-25 DIAGNOSIS — Z1231 Encounter for screening mammogram for malignant neoplasm of breast: Secondary | ICD-10-CM

## 2019-10-19 ENCOUNTER — Emergency Department (HOSPITAL_COMMUNITY): Payer: BC Managed Care – PPO

## 2019-10-19 ENCOUNTER — Other Ambulatory Visit: Payer: Self-pay

## 2019-10-19 ENCOUNTER — Encounter (HOSPITAL_COMMUNITY): Payer: Self-pay | Admitting: Emergency Medicine

## 2019-10-19 ENCOUNTER — Emergency Department (HOSPITAL_COMMUNITY)
Admission: EM | Admit: 2019-10-19 | Discharge: 2019-10-19 | Disposition: A | Discharge status: Home / Self Care | Payer: BC Managed Care – PPO | Attending: Emergency Medicine | Admitting: Emergency Medicine

## 2019-10-19 DIAGNOSIS — M25512 Pain in left shoulder: Secondary | ICD-10-CM | POA: Insufficient documentation

## 2019-10-19 DIAGNOSIS — Z79899 Other long term (current) drug therapy: Secondary | ICD-10-CM | POA: Diagnosis not present

## 2019-10-19 DIAGNOSIS — R0789 Other chest pain: Secondary | ICD-10-CM | POA: Diagnosis not present

## 2019-10-19 DIAGNOSIS — E039 Hypothyroidism, unspecified: Secondary | ICD-10-CM | POA: Insufficient documentation

## 2019-10-19 LAB — BASIC METABOLIC PANEL
Anion gap: 9 (ref 5–15)
BUN: 15 mg/dL (ref 6–20)
CO2: 29 mmol/L (ref 22–32)
Calcium: 9.3 mg/dL (ref 8.9–10.3)
Chloride: 102 mmol/L (ref 98–111)
Creatinine, Ser: 1.08 mg/dL — ABNORMAL HIGH (ref 0.44–1.00)
GFR calc Af Amer: >60 mL/min (ref >60)
GFR calc non Af Amer: 58 mL/min — ABNORMAL LOW (ref >60)
Glucose, Bld: 133 mg/dL — ABNORMAL HIGH (ref 70–99)
Potassium: 3.6 mmol/L (ref 3.5–5.1)
Sodium: 140 mmol/L (ref 135–145)

## 2019-10-19 LAB — I-STAT BETA HCG BLOOD, ED (MC, WL, AP ONLY): I-stat hCG, quantitative: <5 m[IU]/mL (ref <5)

## 2019-10-19 LAB — CBC
HCT: 42.3 % (ref 36.0–46.0)
Hemoglobin: 14.2 g/dL (ref 12.0–15.0)
MCH: 29.8 pg (ref 26.0–34.0)
MCHC: 33.6 g/dL (ref 30.0–36.0)
MCV: 88.7 fL (ref 80.0–100.0)
Platelets: 377 10*3/uL (ref 150–400)
RBC: 4.77 MIL/uL (ref 3.87–5.11)
RDW: 12 % (ref 11.5–15.5)
WBC: 8.2 10*3/uL (ref 4.0–10.5)
nRBC: 0 % (ref 0.0–0.2)

## 2019-10-19 LAB — TROPONIN I (HIGH SENSITIVITY)
Troponin I (High Sensitivity): 3 ng/L (ref <18)
Troponin I (High Sensitivity): 2 ng/L (ref <18)

## 2019-10-19 MED ORDER — LIDOCAINE 5 % EX PTCH
2.0000 | MEDICATED_PATCH | CUTANEOUS | Status: DC
  Administered 2019-10-19: 22:00:00 2 via TRANSDERMAL
  Filled 2019-10-19: qty 2

## 2019-10-19 MED ORDER — SODIUM CHLORIDE 0.9% FLUSH: 3.0000 mL | Freq: Once | INTRAVENOUS | Status: DC

## 2019-10-19 MED ORDER — METHOCARBAMOL 500 MG PO TABS: 500.0000 mg | ORAL_TABLET | Freq: Two times a day (BID) | ORAL | 0 refills | Status: AC

## 2019-10-19 NOTE — ED Triage Notes (Signed)
Pt reports pain to L shoulder blade since last Sunday that radiates to L chest.   Denies SOB, nausea, and vomiting.  States she was seen at an [facility] on Monday and had chest x-ray.  Received call on Tuesday to return for D-dimer due to "spot on left lung".  Prior to returning was told not to come back due to ? COVID symptoms.  States she went to another urgent care and had negative d-dimer and was tested for COVID.  Covid results are still pending.

## 2019-10-19 NOTE — ED Provider Notes (Signed)
[facility] EMERGENCY DEPARTMENT Provider Note   CSN: [id number] Arrival date & time: 10/19/19  1638     History Chief Complaint  Patient presents with   Back Pain   Chest Pain    Susan Santiago is a 54 y.o. female with a past medical history significant for hypothyroidism who presents to the ED due to gradual onset of worsening left shoulder blade pain that radiates to the left side of her chest x1 week.  Patient describes the pain as constant, sometimes sharp, and tight in nature.  Pain is worse when sitting down.  Not worse with exertion.  Patient admits to numbness and tingling in her jaw on Monday and Tuesday which has completely resolved. Patient notes she doesn't have any pain in the morning, but progressively gets worse throughout the day. Patient admits to previous episodes of chest pain in 2014 when she was feeling stressed.  No history of stroke or MI.  Patient denies injury to left shoulder, but admits to lifting a couch 2 weeks ago.  Patient was seen at [facility] urgent care on Monday where they saw a "spot" on her chest and recommended obtaining a D-dimer the following day per the patient.  Patient went to [facility] urgent care on Tuesday and was diagnosed with a pulled muscle of her left shoulder and prescribed a muscle relaxer.  Patient notes her D-dimer was normal on Tuesday at urgent care.  Covid test is still pending.  Patient has tried ibuprofen and Tylenol with no relief.  Shoulder pain is not worse with movement.  Patient denies lower extremity edema, history of blood clots, recent surgeries, recent immobilizations, hormonal treatment.  Patient denies associative shortness of breath, nausea, vomiting, diaphoresis, and abdominal pain.    Past Medical History:  Diagnosis Date   Hypothyroidism    Thyroid disease     Patient Active Problem List   Diagnosis Date Noted   Postmenopausal bleeding 05/22/2019   Endometrial polyp 05/22/2019   Jaw pain  06/02/2013   Pain of right arm 06/02/2013   Back pain 06/02/2013    Past Surgical History:  Procedure Laterality Date   COLONOSCOPY     DILATATION & CURETTAGE/HYSTEROSCOPY WITH MYOSURE N/A 05/22/2019   Procedure: DILATATION & CURETTAGE/HYSTEROSCOPY WITH MYOSURE POLYPECTOMY;  Surgeon: Gerald Leitz, MD;  Location: [facility] OR;  Service: Gynecology;  Laterality: N/A;  Rep will be present.  Confirmed on 05/20/19 CS     OB History   No obstetric history on file.     No family history on file.  Social History   Tobacco Use   Smoking status: Never Smoker   Smokeless tobacco: Never Used  Substance Use Topics   Alcohol use: No    Comment: occassional glass of wine   Drug use: No    Home Medications Prior to Admission medications   Medication Sig Start Date End Date Taking? Authorizing Provider  Calcium Carb-Cholecalciferol (CALCIUM-VITAMIN D) 600-400 MG-UNIT TABS Take 1 tablet by mouth daily.    [provider]  cetirizine (ZYRTEC) 10 MG tablet Take 10 mg by mouth daily.    [provider]  fluticasone (FLONASE) 50 MCG/ACT nasal spray Place 1 spray into both nostrils daily as needed for allergies or rhinitis.    [provider]  ibuprofen (ADVIL) 800 MG tablet Take 1 tablet (800 mg total) by mouth every 8 (eight) hours as needed. 05/22/19   Gerald Leitz, MD  levothyroxine (SYNTHROID) 112 MCG tablet Take 112 mcg by  mouth daily before breakfast.     [provider]  loratadine (CLARITIN) 10 MG tablet Take 10 mg by mouth daily.    [provider]  methocarbamol (ROBAXIN) 500 MG tablet Take 1 tablet (500 mg total) by mouth 2 (two) times daily. 10/19/19   Mannie Stabile, PA-C  Multiple Vitamin (MULTIVITAMIN WITH MINERALS) TABS tablet Take 1 tablet by mouth daily.    [provider]    Allergies    Patient has no known allergies.  Review of Systems   Review of Systems  Constitutional: Negative for chills and fever.  Respiratory:  Negative for cough and shortness of breath.   Cardiovascular: Positive for chest pain (left sided). Negative for palpitations and leg swelling.  Gastrointestinal: Negative for abdominal pain, diarrhea, nausea and vomiting.  Musculoskeletal: Positive for arthralgias and back pain (left scapular pain).  Skin: Negative for rash.  Neurological: Positive for numbness (numbness/tingling to jaw- resolved). Negative for weakness.  All other systems reviewed and are negative.   Physical Exam Updated Vital Signs BP 135/63 (BP Location: Left Arm)    Pulse (!) 58    Temp (!) 97.5 F (36.4 C) (Oral)    Resp 10    Ht 5\' 7"  (1.702 m)    Wt 68.5 kg    SpO2 95%    BMI 23.65 kg/m   Physical Exam Vitals and nursing note reviewed.  Constitutional:      General: She is not in acute distress.    Appearance: She is not ill-appearing.  HENT:     Head: Normocephalic.  Eyes:     Pupils: Pupils are equal, round, and reactive to light.  Cardiovascular:     Rate and Rhythm: Normal rate and regular rhythm.     Pulses: Normal pulses.     Heart sounds: Normal heart sounds. No murmur. No friction rub. No gallop.   Pulmonary:     Effort: Pulmonary effort is normal.     Breath sounds: Normal breath sounds.  Chest:     Comments: No anterior or left-sided chest tenderness to palpation. No overlying rash Abdominal:     General: Abdomen is flat. Bowel sounds are normal. There is no distension.     Palpations: Abdomen is soft.  Musculoskeletal:     Cervical back: Neck supple.     Right lower leg: No edema.     Left lower leg: No edema.     Comments: No tenderness to palpation over left scapular region. No overlying rash. No erythema, edema, or warmth. Full ROM of left shoulder. Radial pulse intact and equal and both upper extremities. No lower extremity edema. Negative homans sign bilaterally  Skin:    General: Skin is warm.     Findings: No rash.  Neurological:     General: No focal deficit present.      Mental Status: She is alert.  Psychiatric:        Mood and Affect: Mood normal.        Behavior: Behavior normal.     ED Results / Procedures / Treatments   Labs (all labs ordered are listed, but only abnormal results are displayed) Labs Reviewed  BASIC METABOLIC PANEL - Abnormal; Notable for the following components:      Result Value   Glucose, Bld 133 (*)    Creatinine, Ser 1.08 (*)    GFR calc non Af Amer 58 (*)    All other components within normal limits  CBC  I-STAT BETA  HCG BLOOD, ED ([facility], [facility], [facility] ONLY)  TROPONIN I (HIGH SENSITIVITY)  TROPONIN I (HIGH SENSITIVITY)    EKG EKG Interpretation  Date/Time:  Saturday October 19 2019 16:57:13 EST Ventricular Rate:  66 PR Interval:  160 QRS Duration: 82 QT Interval:  434 QTC Calculation: 454 R Axis:   88 Text Interpretation: Normal sinus rhythm Normal ECG Confirmed by Quintella Reichert [phone]) on 10/19/2019 6:46:08 PM   Radiology DG Chest 2 View  Result Date: 10/19/2019 CLINICAL DATA:  54 year female with chest pain. EXAM: CHEST - 2 VIEW COMPARISON:  Chest radiograph dated 10/14/2019 FINDINGS: Mild centrilobular emphysema. No focal consolidation or pleural effusion, or pneumothorax. The cardiac silhouette is within normal limits. No acute osseous pathology. IMPRESSION: No active cardiopulmonary disease. Electronically Signed   By: Anner Crete M.D.   On: 10/19/2019 17:20   DG Shoulder Left  Result Date: 10/19/2019 CLINICAL DATA:  Left shoulder pain. EXAM: LEFT SHOULDER - 2+ VIEW COMPARISON:  None. FINDINGS: There is no evidence of fracture or dislocation. Trace inferior glenoid spurring. Mild acromioclavicular degenerative change. Small soft tissue calcification in the region of the rotator cuff insertion. No evidence of focal bone lesion or bony destruction. IMPRESSION: 1. Mild acromioclavicular and glenohumeral osteoarthritis. 2. Small soft tissue calcification in the region of the rotator cuff insertion may  represent calcific tendinopathy. Electronically Signed   By: Keith Rake M.D.   On: 10/19/2019 21:02    Procedures Procedures (including critical care time)  Medications Ordered in ED Medications  sodium chloride flush (NS) 0.9 % injection 3 mL (has no administration in time range)  lidocaine (LIDODERM) 5 % 2 patch (has no administration in time range)    ED Course  I have reviewed the triage vital signs and the nursing notes.  Pertinent labs & imaging results that were available during my care of the patient were reviewed by me and considered in my medical decision making (see chart for details).    MDM Rules/Calculators/A&P                     54 year old female presents to the ED due to left shoulder pain that radiates in the left chest region x1 week.  Patient denies injury, but states she was moving a couch about 2 weeks ago. Patient seen at two different UC for same complaint on Monday and Tuesday with reassuring workup. Patient has been treated by UC for a pulled muscle.  Stable vitals.  Patient in no acute distress and non-ill-appearing.  Physical exam reassuring.  No tenderness to palpation over left shoulder and left side of chest.  Full range of motion of left shoulder.  Neurovascularly intact.  No lower extremity edema.  Negative Homans' sign bilaterally. Chest pain sounds atypical given it is worse when sitting. Heart pathway score of 1. Will order routine labs and serial troponin to rule out ACS.  CBC completely unremarkable with no leukocytosis. BMP reassuring with mild hyperglycemia at 133. EKG reviewed which demonstrates sinus rhythm with no signs of ischemia. CXR personally reviewed which is negative for signs of infection. Shoulder x-ray personally reviewed which demonstrates: 1. Mild acromioclavicular and glenohumeral osteoarthritis.  2. Small soft tissue calcification in the region of the rotator cuff  insertion may represent calcific tendinopathy.    Given  normal EKG and normal delta troponin, doubt ACS. Dissection was considered, but thought to be less likely given HPI and physical exam. Pulses intact and equal in all 4 extremities and normal  CXR. Chart reviewed, d-dimer normal on 10/15/19. No signs of DVT on exam. Doubt PE/DVT. Suspect osteoarthritis as etiology of pain. Will discharge patient with symptomatic treatment. She has already been given Meloxicam from UC. Advised patient to continue taking pain medication as prescribed. Will add Robaxin as needed. Advised patient medication can cause drowsiness and to not drive or operate machinery while on the medication. Patient advised to follow-up with PCP if symptoms do not improve within the next week. Strict ED precautions discussed with patient. Patient states understanding and agrees to plan. Patient discharged home in no acute distress and stable vitals  Final Clinical Impression(s) / ED Diagnoses Final diagnoses:  Acute pain of left shoulder  Atypical chest pain    Rx / DC Orders ED Discharge Orders         Ordered    methocarbamol (ROBAXIN) 500 MG tablet  2 times daily     10/19/19 2118           Jesusita Oka 10/19/19 2131    Tilden Fossa, MD 10/20/19 2356

## 2019-10-19 NOTE — Discharge Instructions (Addendum)
As discussed, all of your labs, EKG, and chest x-ray were normal. Your shoulder x-ray showed a little bit of osteoarthritis. I have included the number for the orthopedic doctor. You can follow-up with orthopedics or your PCP if your shoulder pain does not improve within the next week. Continue taking Meloxicam as needed for pain. I am also sending you home with a muscle relaxer called Robaxin. Do not take the other muscle relaxer you were prescribed with this medication. Medication can cause drowsiness, so do not drive or operate machinery while on the medication. You may also purchase lioderm patches and Voltaren gel over the counter as needed for pain. Return to the ER for new or worsening symptoms.

## 2019-11-30 ENCOUNTER — Ambulatory Visit: Payer: BC Managed Care – PPO | Attending: Internal Medicine

## 2019-11-30 DIAGNOSIS — Z23 Encounter for immunization: Secondary | ICD-10-CM

## 2019-11-30 NOTE — Progress Notes (Signed)
   Covid-19 Vaccination Clinic  Name:  EMILYROSE DARRAH    MRN: [id number] DOB: 09-Sep-1966  11/30/2019  Ms. Winkowski was observed post Covid-19 immunization for 15 minutes without incidence. She was provided with Vaccine Information Sheet and instruction to access the V-Safe system.   Ms. Gatchel was instructed to call 911 with any severe reactions post vaccine: Marland Kitchen Difficulty breathing  . Swelling of your face and throat  . A fast heartbeat  . A bad rash all over your body  . Dizziness and weakness    Immunizations Administered    Name Date Dose VIS Date Route   Pfizer COVID-19 Vaccine 11/30/2019  5:01 PM 0.3 mL 09/13/2019 Intramuscular   Manufacturer: [employer], [employer]   Lot: [id number]   NDC: [address]-2

## 2019-12-21 ENCOUNTER — Ambulatory Visit: Payer: BC Managed Care – PPO | Attending: Internal Medicine

## 2019-12-21 DIAGNOSIS — Z23 Encounter for immunization: Secondary | ICD-10-CM

## 2019-12-21 NOTE — Progress Notes (Signed)
   Covid-19 Vaccination Clinic  Name:  ALESANDRA SMART    MRN: [id number] DOB: 1965/12/31  12/21/2019  Ms. Marksberry was observed post Covid-19 immunization for 15 minutes without incident. She was provided with Vaccine Information Sheet and instruction to access the V-Safe system.   Ms. Mccumbers was instructed to call 911 with any severe reactions post vaccine: Marland Kitchen Difficulty breathing  . Swelling of face and throat  . A fast heartbeat  . A bad rash all over body  . Dizziness and weakness   Immunizations Administered    Name Date Dose VIS Date Route   Pfizer COVID-19 Vaccine 12/21/2019  9:16 AM 0.3 mL 09/13/2019 Intramuscular   Manufacturer: [employer], [employer]   Lot: [id number]   NDC: [address]-3

## 2020-09-04 ENCOUNTER — Other Ambulatory Visit: Payer: Self-pay | Admitting: Obstetrics and Gynecology

## 2020-09-04 DIAGNOSIS — Z1231 Encounter for screening mammogram for malignant neoplasm of breast: Secondary | ICD-10-CM

## 2020-10-30 ENCOUNTER — Ambulatory Visit: Payer: BC Managed Care – PPO

## 2020-11-12 ENCOUNTER — Ambulatory Visit
Admission: RE | Admit: 2020-11-12 | Discharge: 2020-11-12 | Disposition: A | Discharge status: Home / Self Care | Payer: BC Managed Care – PPO | Source: Ambulatory Visit | Attending: Obstetrics and Gynecology | Admitting: Obstetrics and Gynecology

## 2020-11-12 ENCOUNTER — Other Ambulatory Visit: Payer: Self-pay

## 2020-11-12 DIAGNOSIS — Z1231 Encounter for screening mammogram for malignant neoplasm of breast: Secondary | ICD-10-CM

## 2021-11-18 ENCOUNTER — Other Ambulatory Visit: Payer: Self-pay | Admitting: Obstetrics and Gynecology

## 2021-11-18 DIAGNOSIS — Z1231 Encounter for screening mammogram for malignant neoplasm of breast: Secondary | ICD-10-CM

## 2021-11-23 ENCOUNTER — Ambulatory Visit: Payer: BC Managed Care – PPO

## 2021-12-28 ENCOUNTER — Ambulatory Visit
Admission: RE | Admit: 2021-12-28 | Discharge: 2021-12-28 | Disposition: A | Discharge status: Home / Self Care | Payer: BC Managed Care – PPO | Source: Ambulatory Visit | Attending: Obstetrics and Gynecology | Admitting: Obstetrics and Gynecology

## 2021-12-28 ENCOUNTER — Other Ambulatory Visit: Payer: Self-pay

## 2021-12-28 DIAGNOSIS — Z1231 Encounter for screening mammogram for malignant neoplasm of breast: Secondary | ICD-10-CM

## 2022-04-29 IMAGING — MG MM DIGITAL SCREENING BILAT W/ TOMO AND CAD
8 series · 8 of 24 positions shown · non-contrast
Comparison: Previous exam(s).

CLINICAL DATA: Screening.

EXAM:
DIGITAL SCREENING BILATERAL MAMMOGRAM WITH TOMOSYNTHESIS AND CAD
TECHNIQUE: Bilateral screening digital craniocaudal and mediolateral oblique
mammograms were obtained. Bilateral screening digital breast
tomosynthesis was performed. The images were evaluated with
computer-aided detection.

[L MLO synth-2D]
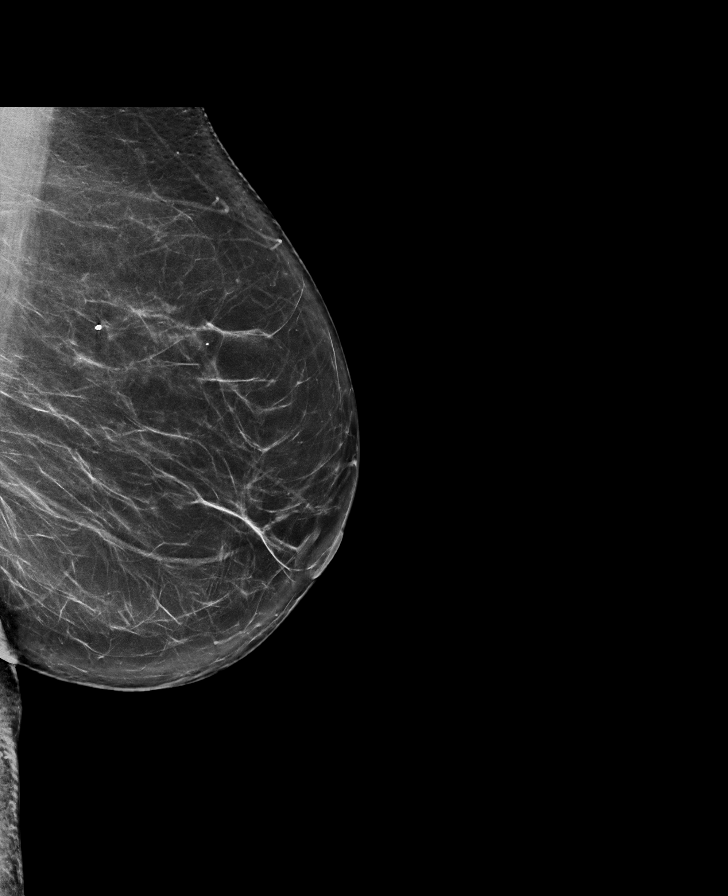

[L CC synth-2D]
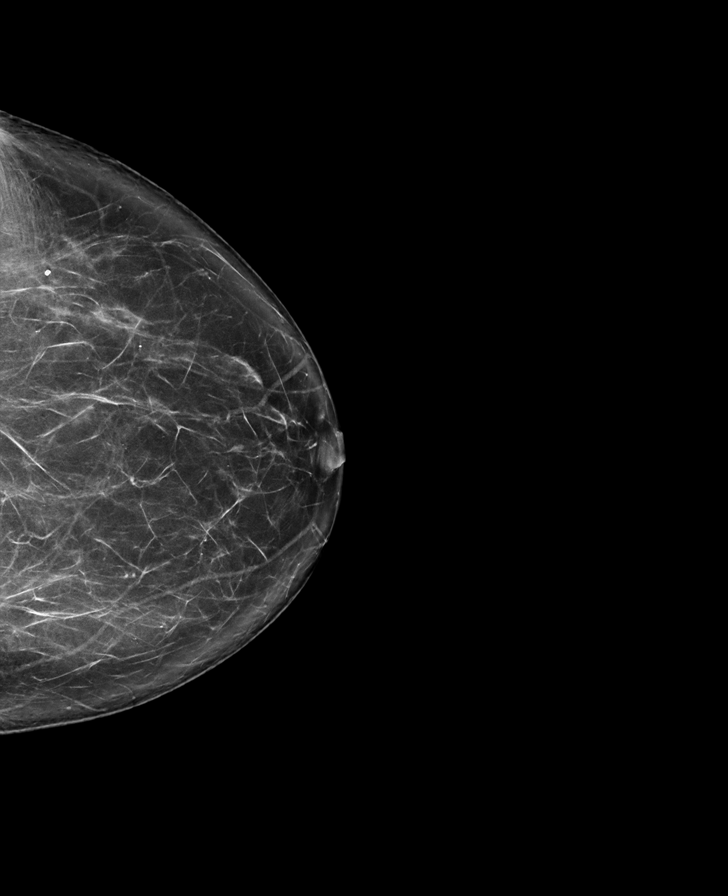

[R MLO synth-2D]
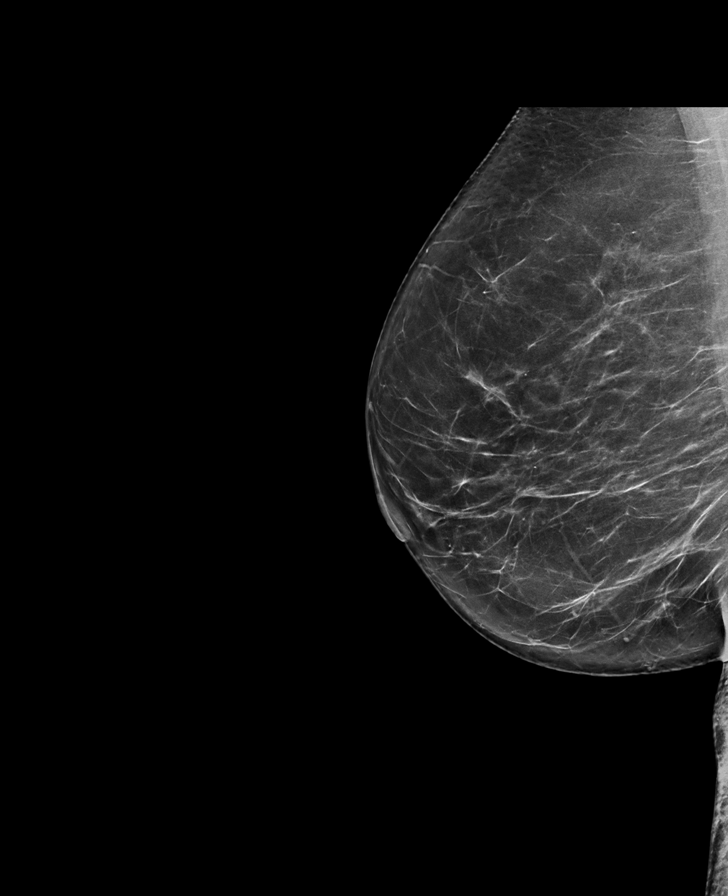

[R CC synth-2D]
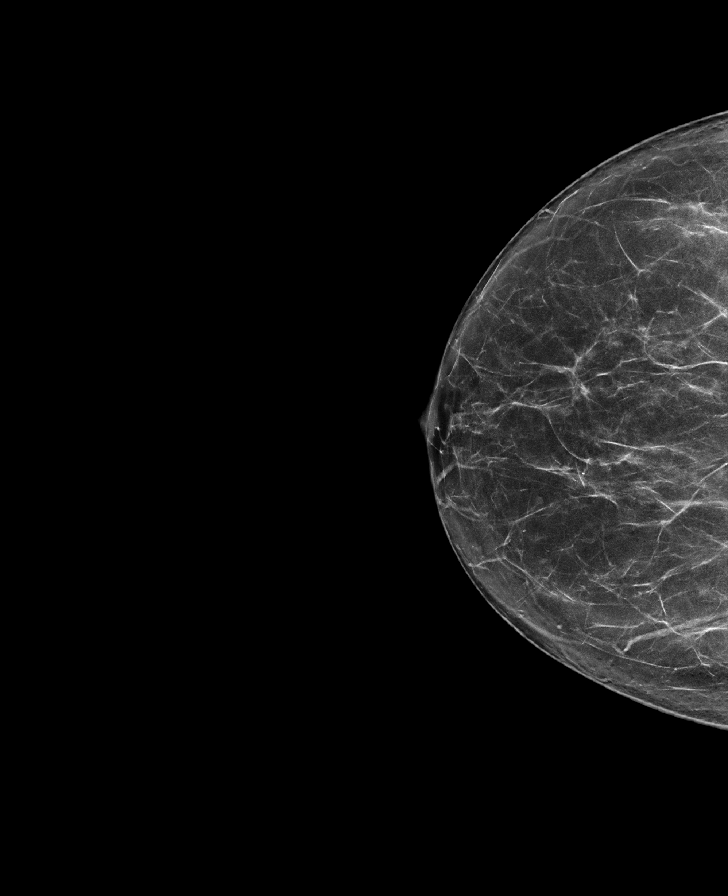

[R MLO tomo · tomo slice 39/78.0]
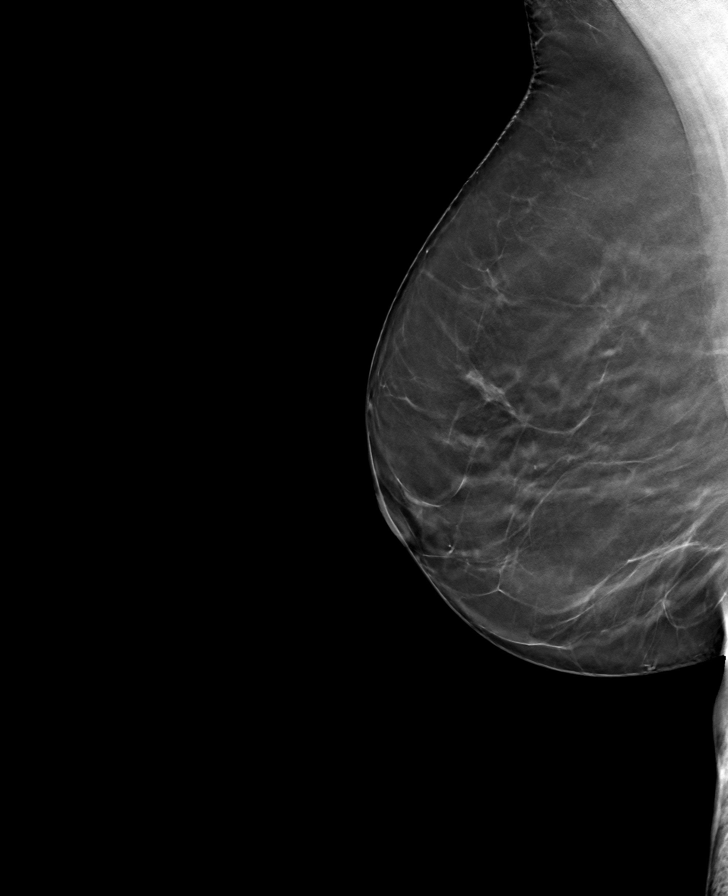

[L MLO tomo · tomo slice 41/82.0]
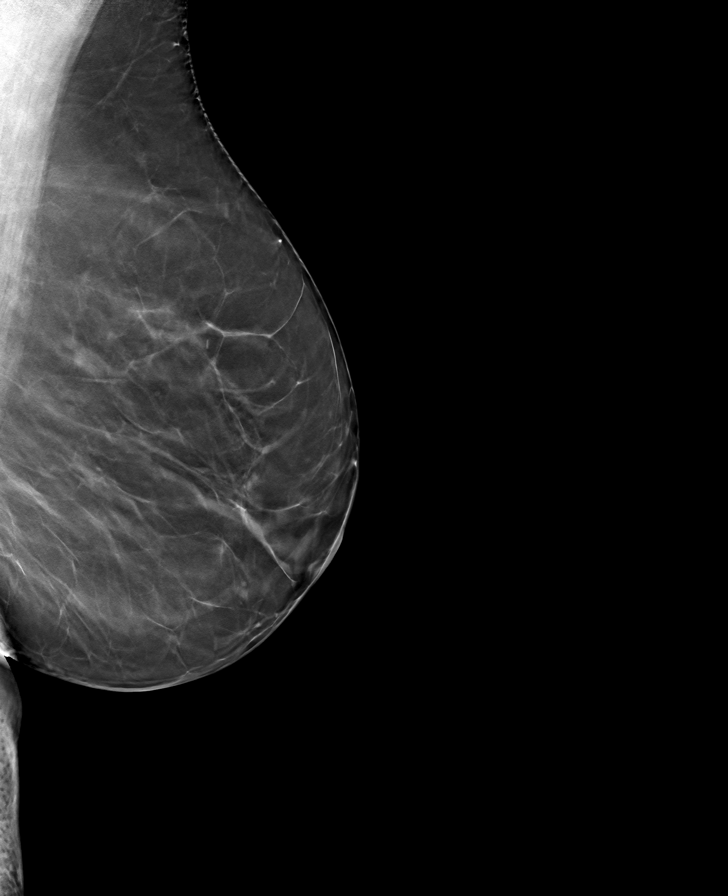

[R CC tomo · tomo slice 39/76.0]
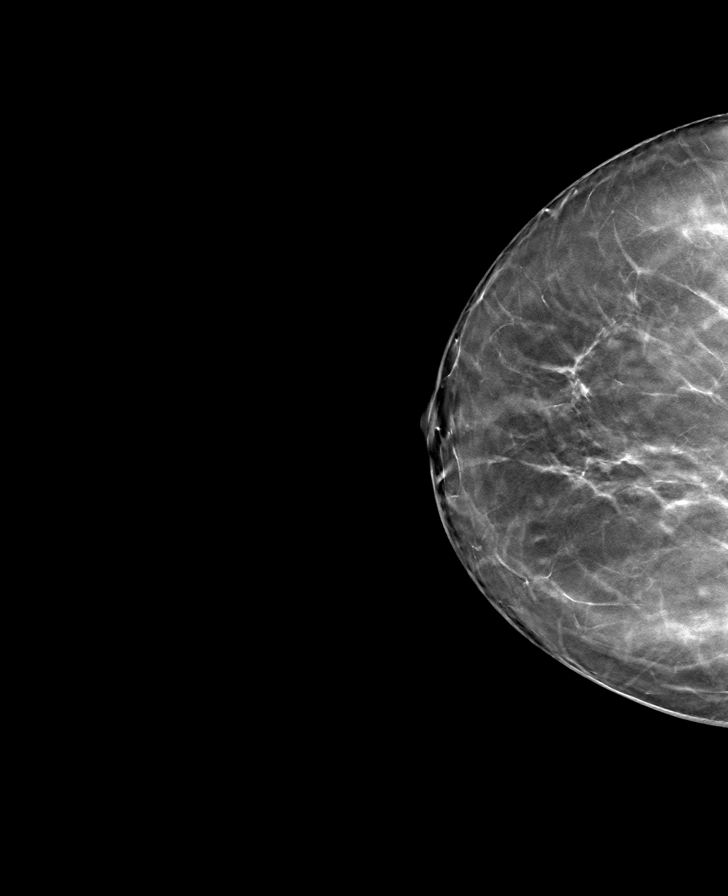

[L CC tomo · tomo slice 42/83.0]
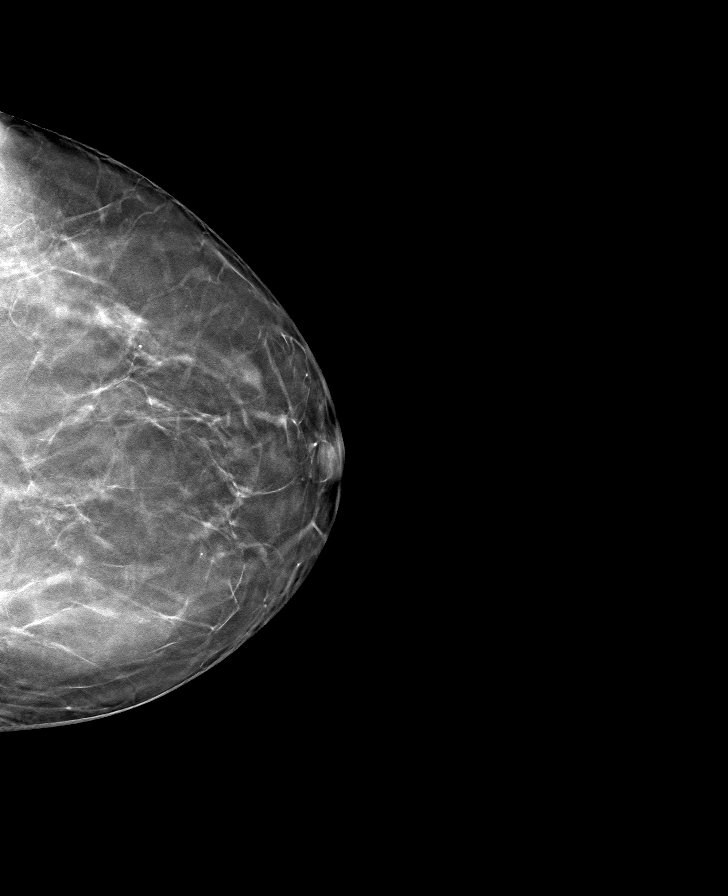

[8 of 24 positions shown; findings below may reference images not displayed]

ACR Breast Density Category b: There are scattered areas of
fibroglandular density.
FINDINGS: There are no findings suspicious for malignancy.
IMPRESSION: No mammographic evidence of malignancy. A result letter of this
screening mammogram will be mailed directly to the patient.

RECOMMENDATION:
Screening mammogram in one year. (Code:51-O-LD2)

BI-RADS CATEGORY  1: Negative.

## 2022-12-12 ENCOUNTER — Other Ambulatory Visit: Payer: Self-pay | Admitting: Obstetrics and Gynecology

## 2022-12-12 ENCOUNTER — Other Ambulatory Visit (HOSPITAL_COMMUNITY)
Admission: RE | Admit: 2022-12-12 | Discharge: 2022-12-12 | Disposition: A | Discharge status: Home / Self Care | Payer: BC Managed Care – PPO | Source: Ambulatory Visit | Attending: Obstetrics and Gynecology | Admitting: Obstetrics and Gynecology

## 2022-12-12 DIAGNOSIS — Z01419 Encounter for gynecological examination (general) (routine) without abnormal findings: Secondary | ICD-10-CM | POA: Diagnosis not present

## 2022-12-16 LAB — CYTOLOGY - PAP
Comment: NEGATIVE
Diagnosis: NEGATIVE
High risk HPV: NEGATIVE

## 2023-03-09 ENCOUNTER — Other Ambulatory Visit: Payer: Self-pay | Admitting: Obstetrics and Gynecology

## 2023-03-09 DIAGNOSIS — Z1231 Encounter for screening mammogram for malignant neoplasm of breast: Secondary | ICD-10-CM

## 2023-03-31 ENCOUNTER — Ambulatory Visit
Admission: RE | Admit: 2023-03-31 | Discharge: 2023-03-31 | Disposition: A | Discharge status: Home / Self Care | Payer: BC Managed Care – PPO | Source: Ambulatory Visit | Attending: Obstetrics and Gynecology | Admitting: Obstetrics and Gynecology

## 2023-03-31 DIAGNOSIS — Z1231 Encounter for screening mammogram for malignant neoplasm of breast: Secondary | ICD-10-CM

## 2023-05-26 ENCOUNTER — Ambulatory Visit: Payer: Self-pay

## 2023-05-26 ENCOUNTER — Other Ambulatory Visit: Payer: Self-pay | Admitting: Family

## 2023-05-26 DIAGNOSIS — R079 Chest pain, unspecified: Secondary | ICD-10-CM

## 2024-03-13 ENCOUNTER — Other Ambulatory Visit: Payer: Self-pay | Admitting: Obstetrics and Gynecology

## 2024-03-13 DIAGNOSIS — Z1231 Encounter for screening mammogram for malignant neoplasm of breast: Secondary | ICD-10-CM

## 2024-04-19 ENCOUNTER — Ambulatory Visit
Admission: RE | Admit: 2024-04-19 | Discharge: 2024-04-19 | Disposition: A | Discharge status: Home / Self Care | Payer: Self-pay | Source: Ambulatory Visit

## 2024-04-19 DIAGNOSIS — Z1231 Encounter for screening mammogram for malignant neoplasm of breast: Secondary | ICD-10-CM
